# Patient Record
Sex: Female | Born: 1950 | Race: White | Hispanic: No | Smoking: Former smoker
Health system: Southern US, Community
[De-identification: ages and names within clinical notes are randomized; demographics above are authoritative.]

## PROBLEM LIST (undated history)

## (undated) DIAGNOSIS — E7800 Pure hypercholesterolemia, unspecified: Secondary | ICD-10-CM

## (undated) DIAGNOSIS — R109 Unspecified abdominal pain: Secondary | ICD-10-CM

## (undated) DIAGNOSIS — K635 Polyp of colon: Secondary | ICD-10-CM

## (undated) DIAGNOSIS — K5900 Constipation, unspecified: Secondary | ICD-10-CM

## (undated) DIAGNOSIS — J449 Chronic obstructive pulmonary disease, unspecified: Secondary | ICD-10-CM

## (undated) DIAGNOSIS — H3500 Unspecified background retinopathy: Secondary | ICD-10-CM

## (undated) DIAGNOSIS — E785 Hyperlipidemia, unspecified: Secondary | ICD-10-CM

## (undated) DIAGNOSIS — K5790 Diverticulosis of intestine, part unspecified, without perforation or abscess without bleeding: Secondary | ICD-10-CM

## (undated) DIAGNOSIS — Z86010 Personal history of colonic polyps: Secondary | ICD-10-CM

## (undated) DIAGNOSIS — R079 Chest pain, unspecified: Secondary | ICD-10-CM

## (undated) DIAGNOSIS — K219 Gastro-esophageal reflux disease without esophagitis: Secondary | ICD-10-CM

## (undated) DIAGNOSIS — R55 Syncope and collapse: Secondary | ICD-10-CM

## (undated) DIAGNOSIS — E669 Obesity, unspecified: Secondary | ICD-10-CM

## (undated) DIAGNOSIS — M1990 Unspecified osteoarthritis, unspecified site: Secondary | ICD-10-CM

## (undated) DIAGNOSIS — I10 Essential (primary) hypertension: Secondary | ICD-10-CM

## (undated) DIAGNOSIS — F32A Depression, unspecified: Secondary | ICD-10-CM

## (undated) DIAGNOSIS — K589 Irritable bowel syndrome without diarrhea: Secondary | ICD-10-CM

## (undated) DIAGNOSIS — R531 Weakness: Secondary | ICD-10-CM

## (undated) DIAGNOSIS — F329 Major depressive disorder, single episode, unspecified: Secondary | ICD-10-CM

## (undated) DIAGNOSIS — K8020 Calculus of gallbladder without cholecystitis without obstruction: Secondary | ICD-10-CM

## (undated) HISTORY — DX: Unspecified abdominal pain: R10.9

## (undated) HISTORY — DX: Obesity, unspecified: E66.9

## (undated) HISTORY — DX: Personal history of colonic polyps: Z86.010

## (undated) HISTORY — DX: Unspecified osteoarthritis, unspecified site: M19.90

## (undated) HISTORY — PX: MASTECTOMY: SHX3

## (undated) HISTORY — DX: Syncope and collapse: R55

## (undated) HISTORY — DX: Weakness: R53.1

## (undated) HISTORY — DX: Chest pain, unspecified: R07.9

## (undated) HISTORY — DX: Chronic obstructive pulmonary disease, unspecified: J44.9

## (undated) HISTORY — PX: BUNIONECTOMY: SHX129

## (undated) HISTORY — DX: Irritable bowel syndrome without diarrhea: K58.9

## (undated) HISTORY — DX: Constipation, unspecified: K59.00

## (undated) HISTORY — DX: Polyp of colon: K63.5

## (undated) HISTORY — DX: Gastro-esophageal reflux disease without esophagitis: K21.9

## (undated) HISTORY — DX: Major depressive disorder, single episode, unspecified: F32.9

## (undated) HISTORY — DX: Depression, unspecified: F32.A

## (undated) HISTORY — DX: Pure hypercholesterolemia, unspecified: E78.00

## (undated) HISTORY — PX: BREAST LUMPECTOMY: SHX2

## (undated) HISTORY — DX: Hyperlipidemia, unspecified: E78.5

## (undated) HISTORY — DX: Calculus of gallbladder without cholecystitis without obstruction: K80.20

## (undated) HISTORY — DX: Diverticulosis of intestine, part unspecified, without perforation or abscess without bleeding: K57.90

## (undated) HISTORY — DX: Unspecified background retinopathy: H35.00

## (undated) HISTORY — PX: CHOLECYSTECTOMY: SHX55

## (undated) HISTORY — DX: Essential (primary) hypertension: I10

---

## 1998-08-11 ENCOUNTER — Other Ambulatory Visit: Admission: RE | Admit: 1998-08-11 | Discharge: 1998-08-11 | Payer: Self-pay | Admitting: *Deleted

## 1999-10-15 DIAGNOSIS — Z86010 Personal history of colonic polyps: Secondary | ICD-10-CM

## 1999-10-15 HISTORY — DX: Personal history of colonic polyps: Z86.010

## 1999-10-26 ENCOUNTER — Encounter: Payer: Self-pay | Admitting: *Deleted

## 1999-10-26 ENCOUNTER — Ambulatory Visit (HOSPITAL_COMMUNITY): Admission: RE | Admit: 1999-10-26 | Discharge: 1999-10-26 | Payer: Self-pay | Admitting: *Deleted

## 2000-01-10 ENCOUNTER — Other Ambulatory Visit: Admission: RE | Admit: 2000-01-10 | Discharge: 2000-01-10 | Payer: Self-pay | Admitting: Internal Medicine

## 2000-01-10 ENCOUNTER — Encounter (INDEPENDENT_AMBULATORY_CARE_PROVIDER_SITE_OTHER): Payer: Self-pay | Admitting: Specialist

## 2000-10-10 ENCOUNTER — Other Ambulatory Visit: Admission: RE | Admit: 2000-10-10 | Discharge: 2000-10-10 | Payer: Self-pay | Admitting: *Deleted

## 2001-01-13 ENCOUNTER — Ambulatory Visit (HOSPITAL_COMMUNITY): Admission: RE | Admit: 2001-01-13 | Discharge: 2001-01-13 | Payer: Self-pay | Admitting: *Deleted

## 2001-01-13 ENCOUNTER — Encounter: Payer: Self-pay | Admitting: *Deleted

## 2001-10-29 ENCOUNTER — Other Ambulatory Visit: Admission: RE | Admit: 2001-10-29 | Discharge: 2001-10-29 | Payer: Self-pay | Admitting: *Deleted

## 2002-12-08 ENCOUNTER — Other Ambulatory Visit: Admission: RE | Admit: 2002-12-08 | Discharge: 2002-12-08 | Payer: Self-pay | Admitting: *Deleted

## 2004-08-21 ENCOUNTER — Ambulatory Visit (HOSPITAL_COMMUNITY): Admission: RE | Admit: 2004-08-21 | Discharge: 2004-08-21 | Payer: Self-pay | Admitting: *Deleted

## 2004-09-18 ENCOUNTER — Encounter: Admission: RE | Admit: 2004-09-18 | Discharge: 2004-09-18 | Payer: Self-pay | Admitting: *Deleted

## 2005-02-20 ENCOUNTER — Other Ambulatory Visit: Admission: RE | Admit: 2005-02-20 | Discharge: 2005-02-20 | Payer: Self-pay | Admitting: *Deleted

## 2005-03-06 ENCOUNTER — Ambulatory Visit: Payer: Self-pay | Admitting: Internal Medicine

## 2005-04-03 ENCOUNTER — Ambulatory Visit: Payer: Self-pay | Admitting: Internal Medicine

## 2006-04-09 ENCOUNTER — Inpatient Hospital Stay (HOSPITAL_COMMUNITY): Admission: AD | Admit: 2006-04-09 | Discharge: 2006-04-12 | Payer: Self-pay | Admitting: Specialist

## 2006-04-10 ENCOUNTER — Ambulatory Visit: Payer: Self-pay | Admitting: Infectious Diseases

## 2008-03-28 ENCOUNTER — Encounter: Admission: RE | Admit: 2008-03-28 | Discharge: 2008-03-28 | Payer: Self-pay | Admitting: Family Medicine

## 2009-04-27 ENCOUNTER — Encounter: Admission: RE | Admit: 2009-04-27 | Discharge: 2009-04-27 | Payer: Self-pay | Admitting: Family Medicine

## 2010-04-19 ENCOUNTER — Encounter (INDEPENDENT_AMBULATORY_CARE_PROVIDER_SITE_OTHER): Payer: Self-pay | Admitting: *Deleted

## 2010-10-14 DIAGNOSIS — K635 Polyp of colon: Secondary | ICD-10-CM

## 2010-10-14 HISTORY — DX: Polyp of colon: K63.5

## 2010-10-16 ENCOUNTER — Encounter (INDEPENDENT_AMBULATORY_CARE_PROVIDER_SITE_OTHER): Payer: Self-pay | Admitting: *Deleted

## 2010-11-15 NOTE — Letter (Signed)
Summary: Pre Visit Letter Revised  [facility]  [address] [place], [place] [address]   Phone: [phone]  Fax: [phone]        10/16/2010 MRN: [id number] [facility] [address] [place], [place]  [address]             Procedure Date:  December 14, 2010   recall col-Dr Dalene Carrow to the Gastroenterology Division at [facility].    You are scheduled to see a nurse for your pre-procedure visit on November 30, 2010 at 9:00am on the 3rd floor at [facility], [address]  We ask that you try to arrive at our office 15 minutes prior to your appointment time to allow for check-in.  Please take a minute to review the attached form.  If you answer "Yes" to one or more of the questions on the first page, we ask that you call the person listed at your earliest opportunity.  If you answer "No" to all of the questions, please complete the rest of the form and bring it to your appointment.    Your nurse visit will consist of discussing your medical and surgical history, your immediate family medical history, and your medications.   If you are unable to list all of your medications on the form, please bring the medication bottles to your appointment and we will list them.  We will need to be aware of both prescribed and over the counter drugs.  We will need to know exact dosage information as well.    Please be prepared to read and sign documents such as consent forms, a financial agreement, and acknowledgement forms.  If necessary, and with your consent, a friend or relative is welcome to sit-in on the nurse visit with you.  Please bring your insurance card so that we may make a copy of it.  If your insurance requires a referral to see a specialist, please bring your referral form from your primary care physician.  No co-pay is required for this nurse visit.     If you cannot keep your appointment, please call [phone] to cancel or reschedule prior to  your appointment date.  This allows [place] the opportunity to schedule an appointment for another patient in need of care.    Thank you for choosing [facility] for your medical needs.  We appreciate the opportunity to care for you.  Please visit [place] at our website  to learn more about our practice.  Sincerely, The Gastroenterology Division

## 2010-11-15 NOTE — Letter (Signed)
Summary: Colonoscopy Letter  [facility]  [address] [place], [place] [address]   Phone: [phone]  Fax: [phone]      April 19, 2010 MRN: [id number]   [facility] [address] [place], [place]  [address]   Dear Teresa Robbins,   According to your medical record, it is time for you to schedule a Colonoscopy. The American Cancer Society recommends this procedure as a method to detect early colon cancer. Patients with a family history of colon cancer, or a personal history of colon polyps or inflammatory bowel disease are at increased risk.  This letter has beeen generated based on the recommendations made at the time of your procedure. If you feel that in your particular situation this may no longer apply, please contact our office.  Please call our office at [phone] to schedule this appointment or to update your records at your earliest convenience.  Thank you for cooperating with [place] to provide you with the very best care possible.   Sincerely,  Hedwig Morton. Juanda Chance, M.D.  [facility] Gastroenterology Division [phone]

## 2010-11-28 ENCOUNTER — Encounter (INDEPENDENT_AMBULATORY_CARE_PROVIDER_SITE_OTHER): Payer: Self-pay | Admitting: *Deleted

## 2010-11-30 ENCOUNTER — Encounter: Payer: Self-pay | Admitting: Internal Medicine

## 2010-12-05 NOTE — Letter (Signed)
Summary: [facility] - Eat Instructions  [facility]  [address] [place], [place] [address]   Phone: [phone]  Fax: [phone]       Teresa Robbins    02-09-1951    MRN: [id number]       Procedure Day /Date: Friday 12/14/2010     Arrival Time: 8:00 am     Procedure Time: 9:00 am     Location of Procedure:                    _x _  [facility] (4th Floor)    PREPARATION FOR COLONOSCOPY WITH MIRALAX  Starting 5 days prior to your procedure Sunday 2/26 do not eat nuts, seeds, popcorn, corn, beans, peas,  salads, or any raw vegetables.  Do not take any fiber supplements (e.g. Metamucil, Citrucel, and Benefiber). ____________________________________________________________________________________________________   THE DAY BEFORE YOUR PROCEDURE         DATE: Thursday 3/1  1   Drink clear liquids the entire day-NO SOLID FOOD  2   Do not drink anything colored red or purple.  Avoid juices with pulp.  No orange juice.  3   Drink at least 64 oz. (8 glasses) of fluid/clear liquids during the day to prevent dehydration and help the prep work efficiently.  CLEAR LIQUIDS INCLUDE: Water Jello Ice Popsicles Tea (sugar ok, no milk/cream) Powdered fruit flavored drinks Coffee (sugar ok, no milk/cream) Gatorade Juice: apple, white grape, white cranberry  Lemonade Clear bullion, consomm, broth Carbonated beverages (any kind) Strained chicken noodle soup Hard Candy  4   Mix the entire bottle of Miralax with 64 oz. of Gatorade/Powerade in the morning and put in the refrigerator to chill.  5   At 3:00 pm take 2 Dulcolax/Bisacodyl tablets.  6   At 4:30 pm take one Reglan/Metoclopramide tablet.  7  Starting at 5:00 pm drink one 8 oz glass of the Miralax mixture every 15-20 minutes until you have finished drinking the entire 64 oz.  You should finish drinking prep around 7:30 or 8:00 pm.  8   If you are nauseated, you may take the 2nd Reglan/Metoclopramide tablet at  6:30 pm.        9    At 8:00 pm take 2 more DULCOLAX/Bisacodyl tablets.     THE DAY OF YOUR PROCEDURE      DATE:  Friday 3/2  You may drink clear liquids until 7:00 am  (2 HOURS BEFORE PROCEDURE).   MEDICATION INSTRUCTIONS  Unless otherwise instructed, you should take regular prescription medications with a small sip of water as early as possible the morning of your procedure.           OTHER INSTRUCTIONS  You will need a responsible adult at least 61 years of age to accompany you and drive you home.   This person must remain in the waiting room during your procedure.  Wear loose fitting clothing that is easily removed.  Leave jewelry and other valuables at home.  However, you may wish to bring a book to read or an iPod/MP3 player to listen to music as you wait for your procedure to start.  Remove all body piercing jewelry and leave at home.  Total time from sign-in until discharge is approximately 2-3 hours.  You should go home directly after your procedure and rest.  You can resume normal activities the day after your procedure.  The day of your procedure you should not:   Drive  Make legal decisions   Operate machinery   Drink alcohol   Return to work  You will receive specific instructions about eating, activities and medications before you leave.   The above instructions have been reviewed and explained to me by  Ezra Sites RN  November 30, 2010 9:29 AM     I fully understand and can verbalize these instructions _____________________________ Date _______

## 2010-12-05 NOTE — Miscellaneous (Signed)
Summary: [facility]  Clinical Lists Changes  Medications: Added new medication of MIRALAX   POWD (POLYETHYLENE GLYCOL 3350) As per prep  instructions. - Signed Added new medication of REGLAN 10 MG  TABS (METOCLOPRAMIDE HCL) As per prep instructions. - Signed Added new medication of DULCOLAX 5 MG  TBEC (BISACODYL) Day before procedure take 2 at 3pm and 2 at 8pm. - Signed Rx of MIRALAX   POWD (POLYETHYLENE GLYCOL 3350) As per prep  instructions.;  #255gm x 0;  Signed;  Entered by: Ezra Sites RN;  Authorized by: Hart Carwin MD;  Method used: Electronically to [facility] [phone]*, [address] [place], [place], [place]  [address], Ph: [phone], Fax: [phone] Rx of REGLAN 10 MG  TABS (METOCLOPRAMIDE HCL) As per prep instructions.;  #2 x 0;  Signed;  Entered by: Ezra Sites RN;  Authorized by: Hart Carwin MD;  Method used: Electronically to [facility] [phone]*, [address], [place], [place], [place]  [address], Ph: [phone], Fax: [phone] Rx of DULCOLAX 5 MG  TBEC (BISACODYL) Day before procedure take 2 at 3pm and 2 at 8pm.;  #4 x 0;  Signed;  Entered by: Ezra Sites RN;  Authorized by: Hart Carwin MD;  Method used: Electronically to [facility] [phone]*, [address] [place], [place], [place]  [address], Ph: [phone], Fax: [phone] Observations: Added new observation of NKA: T (11/30/2010 9:01)    Prescriptions: DULCOLAX 5 MG  TBEC (BISACODYL) Day before procedure take 2 at 3pm and 2 at 8pm.  #4 x 0   Entered by:   Ezra Sites RN   Authorized by:   Hart Carwin MD   Signed by:   Ezra Sites RN on 11/30/2010   Method used:   Electronically to        [facility] [phone]* (retail)       [address]       [place], [place]  [address]       Ph: [phone]       Fax: [phone]   RxID:   [id number] REGLAN 10 MG  TABS (METOCLOPRAMIDE HCL) As per prep instructions.  #2 x  0   Entered by:   Ezra Sites RN   Authorized by:   Hart Carwin MD   Signed by:   Ezra Sites RN on 11/30/2010   Method used:   Electronically to        [facility] [phone]* (retail)       [address]       [place], [place]  [address]       Ph: [phone]       Fax: [phone]   RxID:   [id number] MIRALAX   POWD (POLYETHYLENE GLYCOL 3350) As per prep  instructions.  #255gm x 0   Entered by:   Ezra Sites RN   Authorized by:   Hart Carwin MD   Signed by:   Ezra Sites RN on 11/30/2010   Method used:   Electronically to        [facility] [phone]* (retail)       [address]       [place], [place]  [address]       Ph: [phone]  Fax: [phone]   RxID:   [id number]

## 2010-12-14 ENCOUNTER — Other Ambulatory Visit (AMBULATORY_SURGERY_CENTER): Payer: BC Managed Care – PPO | Admitting: Internal Medicine

## 2010-12-14 ENCOUNTER — Other Ambulatory Visit: Payer: Self-pay | Admitting: Internal Medicine

## 2010-12-14 DIAGNOSIS — K621 Rectal polyp: Secondary | ICD-10-CM

## 2010-12-14 DIAGNOSIS — Z1211 Encounter for screening for malignant neoplasm of colon: Secondary | ICD-10-CM

## 2010-12-14 DIAGNOSIS — D128 Benign neoplasm of rectum: Secondary | ICD-10-CM

## 2010-12-14 DIAGNOSIS — Z800 Family history of malignant neoplasm of digestive organs: Secondary | ICD-10-CM

## 2010-12-14 DIAGNOSIS — K5730 Diverticulosis of large intestine without perforation or abscess without bleeding: Secondary | ICD-10-CM

## 2010-12-14 DIAGNOSIS — D129 Benign neoplasm of anus and anal canal: Secondary | ICD-10-CM

## 2010-12-18 ENCOUNTER — Encounter: Payer: Self-pay | Admitting: Internal Medicine

## 2010-12-20 NOTE — Procedures (Addendum)
Summary: Colonoscopy  Patient: Teresa Robbins Note: All result statuses are Final unless otherwise noted.  Tests: (1) Colonoscopy (COL)   COL Colonoscopy           DONE     [facility]     [address]     [place], [place]  [address]          COLONOSCOPY PROCEDURE REPORT          PATIENT:  Teresa, Robbins  MR#:  [id number]     BIRTHDATE:  1950-10-22, 59 yrs. old  GENDER:  female     ENDOSCOPIST:  Hedwig Morton. Juanda Chance, MD     REF. BY:Dr Noelle Redmon     PROCEDURE DATE:  12/14/2010     PROCEDURE:  Colonoscopy 19147     ASA CLASS:  Class I     INDICATIONS:  family history of colon cancer colon cancer in a     parent, tubular adenoma in 2001     last colon 2006     MEDICATIONS:   Versed 10 mg, Fentanyl 100 mcg, Benadryl 12.5 mg          DESCRIPTION OF PROCEDURE:   After the risks benefits and     alternatives of the procedure were thoroughly explained, informed     consent was obtained.  Digital rectal exam was performed and     revealed no rectal masses.   The LB [id number] endoscope     was introduced through the anus and advanced to the cecum, which     was identified by both the appendix and ileocecal valve, without     limitations.  The quality of the prep was good, using MiraLax.     The instrument was then slowly withdrawn as the colon was fully     examined.     <<PROCEDUREIMAGES>>          FINDINGS:  A diminutive polyp was found in the rectum. The polyp     was removed using cold biopsy forceps (see image7).  Mild     diverticulosis was found (see image5).  This was otherwise a     normal examination of the colon (see image8, image4, image3, and     image2).   Retroflexed views in the rectum revealed no     abnormalities.    The scope was then withdrawn from the patient     and the procedure completed.          COMPLICATIONS:  None     ENDOSCOPIC IMPRESSION:     1) Diminutive polyp in the rectum     2) Mild diverticulosis     3) Otherwise normal  examination     RECOMMENDATIONS:     1) Await pathology results     REPEAT EXAM:  In 5 year(s) for.          ______________________________     Hedwig Morton. Juanda Chance, MD          CC:          n.     eSIGNED:   Hedwig Morton. Aadhya Bustamante at 12/14/2010 09:56 AM          Bryson Corona, [id number]  Note: An exclamation mark (!) indicates a result that was not dispersed into the flowsheet. Document Creation Date: 12/14/2010 9:57 AM _______________________________________________________________________  (1) Order result status: Final Collection or observation date-time: 12/14/2010 09:49 Requested date-time:  Receipt date-time:  Reported date-time:  Referring Physician:  Ordering Physician: Lina Sar [phone]) Specimen Source:  Source: Launa Grill Order Number: [id number] Lab site:   Appended Document: Colonoscopy     Procedures Next Due Date:    Colonoscopy: 12/2015

## 2010-12-25 NOTE — Letter (Addendum)
Summary: Patient Notice- Polyp Results  [facility]  [address] [place], [place] [address]   Phone: [phone]  Fax: [phone]        December 18, 2010 MRN: [id number]    [facility] [address] [place], [place]  [address]    Dear Ms. Sweis,  I am pleased to inform you that the colon polyp(s) removed during your recent colonoscopy was (were) found to be benign (no cancer detected) upon pathologic examination.Your polyp was hyperplastic ( not precancerous)  I recommend you have a repeat colonoscopy examination in _5 years to look for recurrent polyps, as having colon polyps increases your risk for having recurrent polyps or even colon cancer in the future.  Should you develop new or worsening symptoms of abdominal pain, bowel habit changes or bleeding from the rectum or bowels, please schedule an evaluation with either your primary care physician or with me.  Additional information/recommendations:  _x_ No further action with gastroenterology is needed at this time. Please      follow-up with your primary care physician for your other healthcare      needs.  __ Please call [phone] to schedule a return visit to review your      situation.  __ Please keep your follow-up visit as already scheduled.  __ Continue treatment plan as outlined the day of your exam.  Please call us if you are having persistent problems or have questions about your condition that have not been fully answered at this time.  Sincerely,  Hart Carwin MD  This letter has been electronically signed by your physician.  Appended Document: Patient Notice- Polyp Results letter mailed  Appended Document: Patient Notice- Polyp Results Letter returned by [employer]PS- "Return to sender-Insufficient address-unable to forward"  Letter/envelope filed in pt's [facility] chart.

## 2011-01-28 ENCOUNTER — Ambulatory Visit
Admission: RE | Admit: 2011-01-28 | Discharge: 2011-01-28 | Disposition: A | Discharge status: Home / Self Care | Payer: BLUE CROSS/BLUE SHIELD | Source: Ambulatory Visit | Attending: Physician Assistant | Admitting: Physician Assistant

## 2011-01-28 ENCOUNTER — Other Ambulatory Visit: Payer: Self-pay | Admitting: Physician Assistant

## 2011-01-28 ENCOUNTER — Other Ambulatory Visit: Payer: Self-pay | Admitting: Family Medicine

## 2011-01-28 DIAGNOSIS — M79672 Pain in left foot: Secondary | ICD-10-CM

## 2011-03-01 NOTE — Discharge Summary (Signed)
Teresa Robbins, Teresa Robbins NO.:  [id number]   MEDICAL RECORD NO.:  [id number]          PATIENT TYPE:  INP   LOCATION:  6734                         FACILITY:  [facility]   PHYSICIAN:  Kerrin Champagne, M.D.   DATE OF BIRTH:  17-Nov-1950   DATE OF ADMISSION:  04/09/2006  DATE OF DISCHARGE:  04/12/2006                                 DISCHARGE SUMMARY   DISCHARGE DIAGNOSES:  1. Right hand subcutaneous palmar abscess with cellulitis ascending.  2. History of hypertension.  3. History of depression.   PROCEDURES PERFORMED DURING THIS HOSPITALIZATION:  Patient had IV  antibiotics while hospitalized for right hand palmar abscess that had been  drained in the office, this same day of admission.   HISTORY OF PRESENT ILLNESS:  This patient is a 60 year old, right hand  dominant, female.  She has been experiencing pain, discomfort, swelling and  warmth associated with her right hand in the area of the ring finger MCP  joint.  She was seen in our office today after a history of increasing hand  swelling, discomfort and pain, which had not responded to oral antibiotics  over the last 2 to 3 days.  She had a history of smoking one pack of  cigarettes per day.  There is a family history of breast CA.  Primary care  physician is Dr. Azucena Cecil in [place].   PAST MEDICAL HISTORY:  Is significant for:  1. Hypertension.  2. Depression.   PHYSICAL EXAMINATION:  Her physical examination findings, please refer to  the patient's written history and physical examination for these findings.  Basically, an area of open draining wound over the right palmar aspect of  the hand, overlying the MCP joint of the long finger.  This has undergone  local incision and drainage with cultures taken in the office early today.   ADMISSION LABORATORY DATA:  Demonstrated a white cell count of 8.5,  hemoglobin was 13.4, hematocrit 39.4.  Sedimentation rate elevated at 28.  Sodium 134, potassium 3.8, chloride  99, CO2 28, glucose 83, BUN 11,  creatinine 0.7.  Liver function tests were normal.   Patient's admission temperature was 97.7.  Vital signs were stable.   HOSPITAL COURSE:  Teresa Robbins was seen in the office today at [facility] and judged to have a right hand subcutaneous abscess with  ascending cellulitis.  Patient's laboratory tests only showed elevation of  her sedimentation rate; however, her clinical exam was concerning for an  infection that had been partially treated with antibiotics, unsuccessfully  with increasing concern of ascending infection.  She was, therefore,  admitted to [facility], placed on IV antibiotics of vancomycin,  later Cipro was added to this regimen.  Patient underwent cultures at the  office.  She was then begun on, post admission day number 1, with warm soapy  dressing changes with removal of Wic from the wound on the second day of  admission.  Following this, the institution of antibiotics, especially with  the Cipro, the patient showed marked and diminished erythema and swelling of  the right  hand.  Results of the patient's culture and sensitivity indicated  that the patient had infection with gram negative species.  Patient was able  to be transferred to p.o. ciprofloxacin with continued improved in her  status.  On April 12, 2006, her status was felt to be stable and with  decreased erythema and increasing improvement in the wound, the patient was  discharged home on Cipro via oral route.  Patient is to use warm soaks and  soapy water twice a day until healed.  Perform range of motion exercises on  her own.   DISPOSITION:  Teresa Robbins was discharged home on April 12, 2006.  She  will remain on ciprofloxacin 500 mg p.o. twice a day.  She is to take Motrin  or Tylenol as needed for discomfort.  Is seen back in the office for  followup in 2 to 3 days.  She may bath and change the dressing daily.   STATUS AT THE TIME OF  DISCHARGE:  Improved.      Kerrin Champagne, M.D.  Electronically Signed     JEN/MEDQ  D:  05/22/2006  T:  05/22/2006  Job:  [id number]

## 2011-12-10 ENCOUNTER — Telehealth: Payer: Self-pay | Admitting: Internal Medicine

## 2011-12-10 NOTE — Telephone Encounter (Signed)
Spoke with Clydie Braun and gave her appointment date and time. She will fax over Dr. Donnetta Hail records.

## 2011-12-10 NOTE — Telephone Encounter (Signed)
Scheduled patient on 12/17/11 at 2:30 PM with Dr. Juanda Chance. Left a message for Clydie Braun to call me.

## 2011-12-11 ENCOUNTER — Other Ambulatory Visit: Payer: Self-pay | Admitting: *Deleted

## 2011-12-11 ENCOUNTER — Encounter: Payer: Self-pay | Admitting: *Deleted

## 2011-12-17 ENCOUNTER — Ambulatory Visit (INDEPENDENT_AMBULATORY_CARE_PROVIDER_SITE_OTHER): Payer: Managed Care, Other (non HMO) | Admitting: Internal Medicine

## 2011-12-17 ENCOUNTER — Encounter: Payer: Self-pay | Admitting: Internal Medicine

## 2011-12-17 VITALS — BP 128/82 | HR 88 | Ht 64.0 in | Wt 171.4 lb

## 2011-12-17 DIAGNOSIS — K648 Other hemorrhoids: Secondary | ICD-10-CM

## 2011-12-17 MED ORDER — HYDROCORTISONE ACETATE 25 MG RE SUPP: 25.0000 mg | Freq: Every day | RECTAL | Status: DC

## 2011-12-17 NOTE — Patient Instructions (Addendum)
You have been given a separate informational sheet regarding your tobacco use, the importance of quitting and local resources to help you quit. Follow up with Dr Juanda Chance as needed. We have sent the following medications to your pharmacy for you to pick up at your convenience: Anusol Cc: Dr Milus Height, Dr Jennette Kettle

## 2011-12-17 NOTE — Progress Notes (Signed)
Teresa Robbins 05/18/1951 MRN [id number]    History of Present Illness:  This is a 61 year old white female with rectal tissue prolapse which bothers her at times, especially when she has to strain to go to the bathroom. She has been chronically constipated. We have done a colonoscopy in March 2012 and prior to that in 2006 for a family history of multiple colon cancer in her parents. She had a tubular adenoma and hyperplastic rectal polyps. On a recent gynecological exam by Dr. Jennette Kettle, she was found to have a piece of prolapsing tissue.   Past Medical History  Diagnosis Date  . Diverticulosis   . Hyperplastic colon polyp   . Hx of adenomatous colonic polyps   . IBS (irritable bowel syndrome)   . Depression   . Hypertension   . HLD (hyperlipidemia)   . Obesity    Past Surgical History  Procedure Date  . Bunionectomy     bilateral  . Cesarean section     w/tubal ligation  . Breast lumpectomy     right    reports that she has been smoking.  She has never used smokeless tobacco. She reports that she does not drink alcohol or use illicit drugs. family history includes Breast cancer in her cousin, daughter, and paternal aunt; Colon polyps in her father and mother; Depression in her brother and mother; Hypertension in her father and unspecified family member; Prostate cancer in her father; and Stroke in her paternal grandmother. No Known Allergies      Review of Systems: Positive for difficult evacuation of the stool. Positive for constipation. Negative for abdominal pain. Positive for small-volume hematochezia  The remainder of the 10 point ROS is negative except as outlined in H&P   Physical Exam: General appearance  Well developed, in no distress. Eyes- non icteric. HEENT nontraumatic, normocephalic. Mouth no lesions, tongue papillated, no cheilosis. Neck supple without adenopathy, thyroid not enlarged, no carotid bruits, no JVD. Lungs Clear to auscultation bilaterally. Cor  normal soft nontender abdomen with normoactive bowel sounds Rectal: And anoscopic exam reveals several external hemorrhoidal tags, one large tag which is prolapsing through the anal canal and it appears as a fleshy tissue with seepage of mucus and is likely causing the patient's bleeding as well as leakage. Her rectal sphincter tone is diminished. Stool is Hemoccult negative. There is no fissure and no polyps. Extremities no pedal edema. Skin no lesions. Neurological alert and oriented x 3. Psychological normal mood and affect.  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 She is really not having many problems with that although she sees blood occasionally. She has had a recent colonoscopy which ruled out a significant lesion. I gave her an option to have this removed surgically but she does not mind living with it. We have discussed at length her constipation regimen which will include milk of magnesia and MiraLax. I have also encouraged her to be physically active to improve her pelvic floor muscles so she can evacuate stool. We will send her Anusol-HC suppositories to use at night.   12/17/2011 Lina Sar

## 2012-09-04 ENCOUNTER — Other Ambulatory Visit: Payer: Self-pay | Admitting: Physician Assistant

## 2012-09-04 ENCOUNTER — Ambulatory Visit
Admission: RE | Admit: 2012-09-04 | Discharge: 2012-09-04 | Disposition: A | Discharge status: Home / Self Care | Payer: Self-pay | Source: Ambulatory Visit | Attending: Physician Assistant | Admitting: Physician Assistant

## 2012-09-04 DIAGNOSIS — W19XXXA Unspecified fall, initial encounter: Secondary | ICD-10-CM

## 2012-10-16 ENCOUNTER — Encounter (INDEPENDENT_AMBULATORY_CARE_PROVIDER_SITE_OTHER): Payer: Self-pay | Admitting: General Surgery

## 2012-10-16 ENCOUNTER — Ambulatory Visit (INDEPENDENT_AMBULATORY_CARE_PROVIDER_SITE_OTHER): Payer: Self-pay | Admitting: General Surgery

## 2012-10-16 ENCOUNTER — Other Ambulatory Visit (INDEPENDENT_AMBULATORY_CARE_PROVIDER_SITE_OTHER): Payer: Self-pay | Admitting: General Surgery

## 2012-10-16 ENCOUNTER — Ambulatory Visit (INDEPENDENT_AMBULATORY_CARE_PROVIDER_SITE_OTHER): Payer: 59 | Admitting: General Surgery

## 2012-10-16 VITALS — BP 130/72 | HR 74 | Temp 97.8°F | Resp 16 | Ht 64.5 in | Wt 156.2 lb

## 2012-10-16 DIAGNOSIS — K7689 Other specified diseases of liver: Secondary | ICD-10-CM

## 2012-10-16 DIAGNOSIS — K8020 Calculus of gallbladder without cholecystitis without obstruction: Secondary | ICD-10-CM | POA: Insufficient documentation

## 2012-10-16 NOTE — Patient Instructions (Signed)
Your episodes of abdominal pain, fatty food intolerance and nausea are almost certainly due to your multiple gallstones. You will continue to have these attacks until something is done and your gallbladder is removed.  Your ultrasound suggests that you have a complex, septated cyst in the left lobe of the liver, and so we're going to schedule an MRI of the liver to make sure that this is a benign cyst.  Because of your episodes of dizziness and tachycardia, you are scheduled to see a cardiologist, Dr. Verdis Prime on January 8. It would be advisable for him to complete his cardiology evaluation before you have your gallbladder surgery.  You will tentatively be scheduled for laparoscopic cholecystectomy with cholangiogram, possible open cholecystectomy sometime in the latter half of January.     Laparoscopic Cholecystectomy Laparoscopic cholecystectomy is surgery to remove the gallbladder. The gallbladder is located slightly to the right of center in the abdomen, behind the liver. It is a concentrating and storage sac for the bile produced in the liver. Bile aids in the digestion and absorption of fats. Gallbladder disease (cholecystitis) is an inflammation of your gallbladder. This condition is usually caused by a buildup of gallstones (cholelithiasis) in your gallbladder. Gallstones can block the flow of bile, resulting in inflammation and pain. In severe cases, emergency surgery may be required. When emergency surgery is not required, you will have time to prepare for the procedure. Laparoscopic surgery is an alternative to open surgery. Laparoscopic surgery usually has a shorter recovery time. Your common bile duct may also need to be examined and explored. Your caregiver will discuss this with you if he or she feels this should be done. If stones are found in the common bile duct, they may be removed. LET YOUR CAREGIVER KNOW ABOUT:  Allergies to food or medicine.  Medicines taken, including  vitamins, herbs, eyedrops, over-the-counter medicines, and creams.  Use of steroids (by mouth or creams).  Previous problems with anesthetics or numbing medicines.  History of bleeding problems or blood clots.  Previous surgery.  Other health problems, including diabetes and kidney problems.  Possibility of pregnancy, if this applies. RISKS AND COMPLICATIONS All surgery is associated with risks. Some problems that may occur following this procedure include:  Infection.  Damage to the common bile duct, nerves, arteries, veins, or other internal organs such as the stomach or intestines.  Bleeding.  A stone may remain in the common bile duct. BEFORE THE PROCEDURE  Do not take aspirin for 3 days prior to surgery or blood thinners for 1 week prior to surgery.  Do not eat or drink anything after midnight the night before surgery.  Let your caregiver know if you develop a cold or other infectious problem prior to surgery.  You should be present 60 minutes before the procedure or as directed. PROCEDURE  You will be given medicine that makes you sleep (general anesthetic). When you are asleep, your surgeon will make several small cuts (incisions) in your abdomen. One of these incisions is used to insert a small, lighted scope (laparoscope) into the abdomen. The laparoscope helps the surgeon see into your abdomen. Carbon dioxide gas will be pumped into your abdomen. The gas allows more room for the surgeon to perform your surgery. Other operating instruments are inserted through the other incisions. Laparoscopic procedures may not be appropriate when:  There is major scarring from previous surgery.  The gallbladder is extremely inflamed.  There are bleeding disorders or unexpected cirrhosis of the liver.  A pregnancy is near term.  Other conditions make the laparoscopic procedure impossible. If your surgeon feels it is not safe to continue with a laparoscopic procedure, he or she  will perform an open abdominal procedure. In this case, the surgeon will make an incision to open the abdomen. This gives the surgeon a larger view and field to work within. This may allow the surgeon to perform procedures that sometimes cannot be performed with a laparoscope alone. Open surgery has a longer recovery time. AFTER THE PROCEDURE  You will be taken to the recovery area where a nurse will watch and check your progress.  You may be allowed to go home the same day.  Do not resume physical activities until directed by your caregiver.  You may resume a normal diet and activities as directed. Document Released: 09/30/2005 Document Revised: 12/23/2011 Document Reviewed: 03/15/2011 [facility] Patient Information 2013 [place], [place].

## 2012-10-16 NOTE — Progress Notes (Signed)
Patient ID: Teresa Robbins, female   DOB: 01-08-1951, 62 y.o.   MRN: [id number]  Chief Complaint  Patient presents with  . Cholelithiasis    HPI Teresa Robbins is a 62 y.o. female.  She is referred by Norm Parcel, PA and Dr. Lupe Carney at [place] [facility] for evaluation of symptomatic gallstones an incidental finding of a cyst of the left lobe of the liver.  This patient has had 3 or 4 episodes of epigastric pain radiating to her back. This will usually happen in the afternoon in the last few hours and then go away. She also has some fatty food intolerance causing nausea. No change in her bowel habits.  Last  November she had an episode of nausea and dizziness and had a syncopal episode, fell and fractured 3 ribs on the left side. After taking this history, they ordered a gallbladder ultrasound which was done at [facility]. The ultrasound shows that the gallbladder is filled with small gallstones. There is also a somewhat complex cystic mass in the left lower liver with septations and perhaps a little bit of nodularity but no internal color flow. Dimension is 1.9 cm. They recommend MRI.  The patient is asymptomatic today. She states that because of her syncopal episode and a couple of episodes of tachycardia that she is scheduled to see Dr. Garnette Scheuermann on January 8  for cardiology evaluation. She has no prior history of cardiac problems.  Past history is significant for hypertension, mild depression, hemorrhoids, retinopathy and, and a C-section. She gets routine mammograms and colonoscopy. HPI  Past Medical History  Diagnosis Date  . Diverticulosis   . Hyperplastic colon polyp   . Hx of adenomatous colonic polyps   . IBS (irritable bowel syndrome)   . Depression   . Hypertension   . Obesity   . Retinopathy   . HLD (hyperlipidemia)   . Hypercholesterolemia   . Gallstones   . Chest pain   . Abdominal pain   . Constipation   . Fainting   . Weakness     Past Surgical History  Procedure  Date  . Bunionectomy 1995 - approximate    bilateral  . Breast lumpectomy 1997 - approximate    right  . Cesarean section 1987    w/tubal ligation    Family History  Problem Relation Age of Onset  . Colon polyps Mother   . Depression Mother   . COPD Mother   . Colon polyps Father   . Prostate cancer Father   . Hypertension Father   . Cancer Father     prostate  . Depression Brother   . Stroke Paternal Grandmother   . Breast cancer Daughter   . Cancer Daughter     breast  . Breast cancer Paternal Aunt   . Cancer Paternal Aunt     breast  . Breast cancer Cousin   . Cancer Cousin     breast - on mom's side  . Hypertension      brother, sister, daughter    Social History History  Substance Use Topics  . Smoking status: Former Smoker    Quit date: 03/13/2012  . Smokeless tobacco: Never Used  . Alcohol Use: No    No Known Allergies  Current Outpatient Prescriptions  Medication Sig Dispense Refill  . amLODipine (NORVASC) 10 MG tablet Take 10 mg by mouth daily.      Marland Kitchen aspirin 81 MG tablet Take 81 mg by mouth daily.      Marland Kitchen  Calcium Carbonate-Vitamin D (CALCIUM 600/VITAMIN D) 600-400 MG-UNIT per tablet Take 1 tablet by mouth daily.      Marland Kitchen estrogen, conjugated,-medroxyprogesterone (PREMPRO) 0.625-2.5 MG per tablet Take 1 tablet by mouth daily.      . irbesartan (AVAPRO) 300 MG tablet Take 300 mg by mouth at bedtime.      . Multiple Vitamins-Minerals (MULTIVITAMIN PO) Take by mouth daily.      Marland Kitchen PARoxetine (PAXIL) 10 MG tablet Take 10 mg by mouth every morning.        Review of Systems Review of Systems  Constitutional: Negative for fever, chills and unexpected weight change.  HENT: Negative for hearing loss, congestion, sore throat, trouble swallowing and voice change.   Eyes: Negative for visual disturbance.  Respiratory: Positive for wheezing. Negative for cough.   Cardiovascular: Positive for palpitations. Negative for chest pain and leg swelling.    Gastrointestinal: Positive for nausea and abdominal pain. Negative for vomiting, diarrhea, constipation, blood in stool, abdominal distention and anal bleeding.  Genitourinary: Negative for hematuria, vaginal bleeding and difficulty urinating.  Musculoskeletal: Negative for arthralgias.  Skin: Negative for rash and wound.  Neurological: Positive for syncope. Negative for seizures and headaches.  Hematological: Negative for adenopathy. Does not bruise/bleed easily.  Psychiatric/Behavioral: Negative for confusion.    Blood pressure 130/72, pulse 74, temperature 97.8 F (36.6 C), temperature source Temporal, resp. rate 16, height 5' 4.5" (1.638 m), weight 156 lb 4 oz (70.875 kg).  Physical Exam Physical Exam  Constitutional: She is oriented to person, place, and time. She appears well-developed and well-nourished. No distress.  HENT:  Head: Normocephalic and atraumatic.  Nose: Nose normal.  Mouth/Throat: No oropharyngeal exudate.  Eyes: Conjunctivae normal and EOM are normal. Pupils are equal, round, and reactive to light. Left eye exhibits no discharge. No scleral icterus.  Neck: Neck supple. No JVD present. No tracheal deviation present. No thyromegaly present.  Cardiovascular: Normal rate, regular rhythm, normal heart sounds and intact distal pulses.   No murmur heard. Pulmonary/Chest: Effort normal and breath sounds normal. No respiratory distress. She has no wheezes. She has no rales. She exhibits no tenderness.       Tender to percussion the left chest wall due to rib fracture  Abdominal: Soft. Bowel sounds are normal. She exhibits no distension and no mass. There is no tenderness. There is no rebound and no guarding.       Abdomen soft. Nontender. Liver not enlarged. No hernias noted.Pfannenstiel incision.  Musculoskeletal: She exhibits no edema and no tenderness.  Lymphadenopathy:    She has no cervical adenopathy.  Neurological: She is alert and oriented to person, place, and  time. She exhibits normal muscle tone. Coordination normal.  Skin: Skin is warm. No rash noted. She is not diaphoretic. No erythema. No pallor.  Psychiatric: She has a normal mood and affect. Her behavior is normal. Judgment and thought content normal.    Data Reviewed Office notes and lab work from [place]. Ultrasound at [facility].  Assessment    Chronic cholecystitis with cholelithiasis. Almost certainly she is having attacks of biliary colic. She is at risk for progression and elective cholecystectomy is advised.  1.9 cm complex cyst left lobe of liver. Probably benign. Preop MRI would be recommended to rule out neoplasia.  Palpitations and syncopal episode. Cardiology evaluation with Dr. Katrinka Blazing pending  History cesarean section  Hypertension    Plan    Cardiology evaluation scheduled January 8 with Dr. Katrinka Blazing. His evaluation should be completed prior to  general anesthesia  Schedule MRI of the liver to evaluate complex cyst of left lobe of liver  At the patient's request, we will tentatively schedule her for laparoscopic cholecystectomy with cholangiogram later this month after the other evaluations are completed. She requests that we do this at the [facility] since she has a family member that works there.   I think that would be appropriate, assuming cardiology evaluation is in order since she is otherwise healthy.  I discussed the indications, details, techniques, and numerous risks of the surgery with her. She's been given given written patient information. She understands these issues. Her questions were answered.  she agrees with this plan.       Angelia Mould. Derrell Lolling, M.D., [facility] General and Minimally invasive Surgery Breast and Colorectal Surgery Office:   [phone] Pager:   [phone]  10/16/2012, 4:35 PM

## 2012-10-16 NOTE — Progress Notes (Signed)
Faxed copy of ultrasound of abdomen dated 09/29/12 to [facility] for their records. The ultrasound was performed at [facility]. Confirmation of fax received.

## 2012-10-22 ENCOUNTER — Ambulatory Visit
Admission: RE | Admit: 2012-10-22 | Discharge: 2012-10-22 | Disposition: A | Discharge status: Home / Self Care | Payer: 59 | Source: Ambulatory Visit | Attending: General Surgery | Admitting: General Surgery

## 2012-10-22 MED ORDER — GADOBENATE DIMEGLUMINE 529 MG/ML IV SOLN
14.0000 mL | Freq: Once | INTRAVENOUS | Status: AC | PRN
  Administered 2012-10-22: 14 mL via INTRAVENOUS

## 2012-10-23 ENCOUNTER — Telehealth (INDEPENDENT_AMBULATORY_CARE_PROVIDER_SITE_OTHER): Payer: Self-pay | Admitting: General Surgery

## 2012-10-23 NOTE — Telephone Encounter (Signed)
Patient called per Dr. Jacinto Halim request to advise of the radiology report "tell her that the liver spot is a benign cyst and nothing further needs to be done. Good news". Patient pleased with results.

## 2012-11-10 ENCOUNTER — Other Ambulatory Visit (INDEPENDENT_AMBULATORY_CARE_PROVIDER_SITE_OTHER): Payer: Self-pay | Admitting: General Surgery

## 2012-11-10 DIAGNOSIS — K8010 Calculus of gallbladder with chronic cholecystitis without obstruction: Secondary | ICD-10-CM

## 2012-11-24 ENCOUNTER — Encounter (INDEPENDENT_AMBULATORY_CARE_PROVIDER_SITE_OTHER): Payer: Self-pay | Admitting: General Surgery

## 2012-11-24 ENCOUNTER — Ambulatory Visit (INDEPENDENT_AMBULATORY_CARE_PROVIDER_SITE_OTHER): Payer: 59 | Admitting: General Surgery

## 2012-11-24 VITALS — BP 122/76 | HR 78 | Temp 98.0°F | Resp 18 | Ht 64.0 in | Wt 151.0 lb

## 2012-11-24 DIAGNOSIS — K8020 Calculus of gallbladder without cholecystitis without obstruction: Secondary | ICD-10-CM

## 2012-11-24 NOTE — Progress Notes (Signed)
Patient ID: Teresa Robbins, female   DOB: October 10, 1951, 62 y.o.   MRN: [id number] History: This patient underwent laparoscopic cholecystectomy with cholangiogram on 11/10/2012. Final pathology report shows chronic cholecystitis with cholelithiasis. She has recovered uneventfully. Tolerating regular diet. Normal bowel movements. No significant wound problems  Exam: Patient looks well. No distress Abdomen soft. Nontender. All trocar sites healing well  Assessment: Chronic cholecystitis with cholelithiasis, uneventful recovery following laparoscopic cholecystectomy with cholangiogram  Plan: resume normal activities. No restrictions Low-fat diet Return to see me if further problems arise.   Angelia Mould. Derrell Lolling, M.D., [facility] General and Minimally invasive Surgery Breast and Colorectal Surgery Office:   [phone] Pager:   [phone]

## 2012-11-24 NOTE — Patient Instructions (Signed)
You appear to be recovering from your gallbladder surgery without any surgical complications.  You had been given a copy of the pathology report and a copy of the MRI of the liver.  I advise a low fat, high fiber diet.  You may resume normal physical activities without restriction  Return to see Dr. Derrell Lolling if further problems arise.

## 2012-11-30 ENCOUNTER — Encounter (INDEPENDENT_AMBULATORY_CARE_PROVIDER_SITE_OTHER): Payer: Self-pay

## 2013-07-14 ENCOUNTER — Other Ambulatory Visit: Payer: Self-pay | Admitting: Physician Assistant

## 2013-07-14 ENCOUNTER — Ambulatory Visit
Admission: RE | Admit: 2013-07-14 | Discharge: 2013-07-14 | Disposition: A | Discharge status: Home / Self Care | Payer: 59 | Source: Ambulatory Visit | Attending: Physician Assistant | Admitting: Physician Assistant

## 2013-07-14 DIAGNOSIS — M542 Cervicalgia: Secondary | ICD-10-CM

## 2014-01-04 ENCOUNTER — Ambulatory Visit: Payer: 59 | Admitting: Nurse Practitioner

## 2014-01-07 ENCOUNTER — Ambulatory Visit: Payer: 59 | Admitting: Nurse Practitioner

## 2015-01-10 ENCOUNTER — Other Ambulatory Visit: Payer: Self-pay | Admitting: Obstetrics & Gynecology

## 2015-01-11 LAB — CYTOLOGY - PAP

## 2015-03-22 ENCOUNTER — Other Ambulatory Visit: Payer: Self-pay | Admitting: Obstetrics & Gynecology

## 2015-03-22 DIAGNOSIS — R928 Other abnormal and inconclusive findings on diagnostic imaging of breast: Secondary | ICD-10-CM

## 2015-03-28 ENCOUNTER — Ambulatory Visit
Admission: RE | Admit: 2015-03-28 | Discharge: 2015-03-28 | Disposition: A | Discharge status: Home / Self Care | Payer: 59 | Source: Ambulatory Visit | Attending: Obstetrics & Gynecology | Admitting: Obstetrics & Gynecology

## 2015-03-28 ENCOUNTER — Other Ambulatory Visit: Payer: Self-pay | Admitting: Obstetrics & Gynecology

## 2015-03-28 DIAGNOSIS — R928 Other abnormal and inconclusive findings on diagnostic imaging of breast: Secondary | ICD-10-CM

## 2015-03-28 DIAGNOSIS — N6320 Unspecified lump in the left breast, unspecified quadrant: Secondary | ICD-10-CM

## 2015-03-29 ENCOUNTER — Other Ambulatory Visit: Payer: Self-pay | Admitting: Obstetrics & Gynecology

## 2015-03-29 DIAGNOSIS — N6320 Unspecified lump in the left breast, unspecified quadrant: Secondary | ICD-10-CM

## 2015-03-30 ENCOUNTER — Ambulatory Visit
Admission: RE | Admit: 2015-03-30 | Discharge: 2015-03-30 | Disposition: A | Discharge status: Home / Self Care | Payer: 59 | Source: Ambulatory Visit | Attending: Obstetrics & Gynecology | Admitting: Obstetrics & Gynecology

## 2015-03-30 DIAGNOSIS — N6320 Unspecified lump in the left breast, unspecified quadrant: Secondary | ICD-10-CM

## 2015-04-06 ENCOUNTER — Other Ambulatory Visit: Payer: 59

## 2015-04-07 ENCOUNTER — Telehealth: Payer: Self-pay | Admitting: *Deleted

## 2015-04-07 NOTE — Telephone Encounter (Signed)
Received referral from [place].  Called pt and confirmed 04/12/15 med onc appt and 7/5 genetic appt w/ pt.  Mailed before appt letter, calendar, welcoming packet & intake form to pt. Emailed [place] & [place] at [facility] to make them aware.  Placed a copy of records in Dr. Geralyn Flash box and took one to HIM to scan.

## 2015-04-10 ENCOUNTER — Telehealth: Payer: Self-pay | Admitting: *Deleted

## 2015-04-10 NOTE — Telephone Encounter (Signed)
Pt called requesting for me to cancel her appts w/ med onc and genetics due to her going to [facility] to be treated since her daughter goes there and they know the family history.  I have cancelled the appts as requested and emailed Bary Castilla and [place] at [facility] to make them aware.

## 2015-04-12 ENCOUNTER — Ambulatory Visit: Payer: 59 | Admitting: Hematology and Oncology

## 2015-04-12 ENCOUNTER — Ambulatory Visit: Payer: 59

## 2015-04-18 ENCOUNTER — Encounter: Payer: 59 | Admitting: Genetic Counselor

## 2015-04-18 ENCOUNTER — Other Ambulatory Visit: Payer: 59

## 2015-05-02 ENCOUNTER — Ambulatory Visit
Admission: RE | Admit: 2015-05-02 | Discharge: 2015-05-02 | Disposition: A | Discharge status: Home / Self Care | Payer: 59 | Source: Ambulatory Visit | Attending: Physician Assistant | Admitting: Physician Assistant

## 2015-05-02 ENCOUNTER — Other Ambulatory Visit: Payer: Self-pay | Admitting: Physician Assistant

## 2015-05-02 DIAGNOSIS — R05 Cough: Secondary | ICD-10-CM

## 2015-05-02 DIAGNOSIS — R059 Cough, unspecified: Secondary | ICD-10-CM

## 2015-06-08 ENCOUNTER — Other Ambulatory Visit: Payer: Self-pay | Admitting: Physician Assistant

## 2015-06-08 ENCOUNTER — Ambulatory Visit
Admission: RE | Admit: 2015-06-08 | Discharge: 2015-06-08 | Disposition: A | Discharge status: Home / Self Care | Payer: 59 | Source: Ambulatory Visit | Attending: Physician Assistant | Admitting: Physician Assistant

## 2015-06-08 DIAGNOSIS — J189 Pneumonia, unspecified organism: Secondary | ICD-10-CM

## 2015-09-11 ENCOUNTER — Encounter: Payer: Self-pay | Admitting: Internal Medicine

## 2015-12-06 ENCOUNTER — Encounter: Payer: Self-pay | Admitting: Gastroenterology

## 2017-01-20 ENCOUNTER — Encounter: Payer: Self-pay | Admitting: Gastroenterology

## 2017-02-14 ENCOUNTER — Encounter: Payer: Self-pay | Admitting: *Deleted

## 2017-02-18 ENCOUNTER — Encounter: Payer: Self-pay | Admitting: Gastroenterology

## 2017-02-18 ENCOUNTER — Ambulatory Visit (INDEPENDENT_AMBULATORY_CARE_PROVIDER_SITE_OTHER): Payer: 59 | Admitting: Gastroenterology

## 2017-02-18 VITALS — BP 120/74 | HR 72 | Ht 63.5 in | Wt 147.4 lb

## 2017-02-18 DIAGNOSIS — K649 Unspecified hemorrhoids: Secondary | ICD-10-CM | POA: Diagnosis not present

## 2017-02-18 DIAGNOSIS — K625 Hemorrhage of anus and rectum: Secondary | ICD-10-CM

## 2017-02-18 DIAGNOSIS — R140 Abdominal distension (gaseous): Secondary | ICD-10-CM

## 2017-02-18 DIAGNOSIS — K5900 Constipation, unspecified: Secondary | ICD-10-CM | POA: Diagnosis not present

## 2017-02-18 DIAGNOSIS — Z86010 Personal history of colonic polyps: Secondary | ICD-10-CM

## 2017-02-18 NOTE — Progress Notes (Signed)
Teresa Robbins    [id number]    26-Oct-1950  Primary Care Physician:Redmon, Enid Cutter  Referring Physician: [name], PA-C [address] [place], [place] [address]  Chief complaint: Hemorrhoids, rectal bleeding, bloating, constipation  HPI: 66 year old female here for evaluation with complaints of intermittent bright red blood per rectum, protruding hemorrhoids, constipation and excessive bloating. She was previously followed by Dr. [name] and was last seen in office 12/17/2011. She is currently using Miralax 1 capful daily with BM once or twice a week. She notices bright red blood per rectum after every bowel movement and every time she wipes. She also has sensation of increased pressure and fullness in the rectum. Patient has had multiple colonoscopies, most recent in March 2012 with removal of hyperplastic polyp. Prior to that in 2006 was normal. March 2001 with removal of 2 diminutive polyps one of which was tubular adenoma. Family history positive for polyps but no history of colon cancer.   Outpatient Encounter Prescriptions as of 02/18/2017  Medication Sig  . amLODipine (NORVASC) 10 MG tablet Take 10 mg by mouth daily.  Marland Kitchen aspirin 81 MG tablet Take 81 mg by mouth daily.  . Calcium Carbonate-Vitamin D (CALCIUM 600/VITAMIN D) 600-400 MG-UNIT per tablet Take 1 tablet by mouth daily.  . hydrochlorothiazide (HYDRODIURIL) 25 MG tablet Take 25 mg by mouth daily.  . irbesartan (AVAPRO) 300 MG tablet Take 300 mg by mouth at bedtime.  . Multiple Vitamins-Minerals (MULTIVITAMIN PO) Take by mouth daily.  Marland Kitchen PARoxetine (PAXIL) 10 MG tablet Take 10 mg by mouth every morning.  . [DISCONTINUED] estrogen, conjugated,-medroxyprogesterone (PREMPRO) 0.625-2.5 MG per tablet Take 1 tablet by mouth daily.   No facility-administered encounter medications on file as of 02/18/2017.     Allergies as of 02/18/2017 - Review Complete 02/18/2017  Allergen Reaction Noted  .  Septra [sulfamethoxazole-trimethoprim] Other (See Comments) 02/18/2017    Past Medical History:  Diagnosis Date  . Abdominal pain   . Chest pain   . Constipation   . Depression   . Diverticulosis   . Fainting   . Gallstones   . HLD (hyperlipidemia)   . Hx of adenomatous colonic polyps   . Hypercholesterolemia   . Hyperplastic colon polyp   . Hypertension   . IBS (irritable bowel syndrome)   . Obesity   . Retinopathy   . Weakness     Past Surgical History:  Procedure Laterality Date  . BREAST LUMPECTOMY Right 1997 - approximate  . BUNIONECTOMY Bilateral 1995 - approximate  . [place]   w/tubal ligation  . CHOLECYSTECTOMY    . MASTECTOMY Bilateral     Family History  Problem Relation Age of Onset  . Colon polyps Mother   . Depression Mother   . COPD Mother   . Colon polyps Father   . Prostate cancer Father   . Hypertension Father   . Breast cancer Daughter   . Leukemia Daughter   . Hypertension Daughter   . Breast cancer Paternal Aunt   . Breast cancer Cousin     breast - on mom's side  . Depression Brother   . Hypertension Brother   . Stroke Paternal Grandmother   . Hypertension Sister   . Hypertension Brother     Social History   Social History  . Marital status: Married    Spouse name: N/A  . Number of children: 3  . Years of education:  N/A   Occupational History  . Consulting civil engineer   Social History Main Topics  . Smoking status: Current Every Day Smoker  . Smokeless tobacco: Never Used  . Alcohol use No  . Drug use: No  . Sexual activity: Not on file   Other Topics Concern  . Not on file   Social History Narrative  . No narrative on file      Review of systems: Review of Systems  Constitutional: Negative for fever and chills.  HENT: Negative.   Eyes: Negative for blurred vision.  Respiratory: Negative for cough, shortness of breath and wheezing.   Cardiovascular: Negative for  chest pain and palpitations.  Gastrointestinal: as per HPI Genitourinary: Negative for dysuria, urgency, frequency and hematuria.  Musculoskeletal: Negative for myalgias, back pain and joint pain.  Skin: Negative for itching and rash.  Neurological: Negative for dizziness, tremors, focal weakness, seizures and loss of consciousness.  Endo/Heme/Allergies: Positive for seasonal allergies.  Psychiatric/Behavioral: Negative for depression, suicidal ideas and hallucinations.  All other systems reviewed and are negative.   Physical Exam: Vitals:   02/18/17 1439  BP: 120/74  Pulse: 72   Body mass index is 25.7 kg/m. Gen:      No acute distress HEENT:  EOMI, sclera anicteric Neck:     No masses; no thyromegaly Lungs:    Clear to auscultation bilaterally; normal respiratory effort CV:         Regular rate and rhythm; no murmurs Abd:      + bowel sounds; soft, non-tender; no palpable masses, no distension Ext:    No edema; adequate peripheral perfusion Skin:      Warm and dry; no rash Neuro: alert and oriented x 3 Psych: normal mood and affect Rectal exam: Normal anal sphincter tone, no anal fissure or external hemorrhoids Anoscopy: Grade 3 internal hemorrhoids in left lateral and right posterior, grade 2 internal hemorrhoids in the right anterior no active bleeding, normal dentate line, no visible nodules  Data Reviewed:  Reviewed labs, radiology imaging, old records and pertinent past GI work up   Assessment and Plan/Recommendations:  66 year old female with history of chronic constipation, symptomatic hemorrhoids and small-volume bright red blood per rectum Evidence of grade 2-3 internal hemorrhoids likely etiology of small-volume rectal bleeding We'll start Linzess  145 g daily, titrate based on response Benefiber 1 tablespoon 3 times daily with meals Increase dietary fluid and fiber intake We'll schedule for band ligation of hemorrhoids once constipation has improved Advised  patient to avoid excessive straining and sitting on toilet for prolonged periods of time Excessive bloating likely in the setting of severe constipation If no improvement despite laxatives, we'll consider anorectal manometry to evaluate for possible dyssynergia defecation  Colorectal cancer screening: Due for recall colonoscopy in March 2022 Diminutive tubular adenoma removed in 2001, subsequent colonoscopy in 2006 and 2012 with no adenomatous polyp   K. Denzil Magnuson , MD [phone] Mon-Fri 8a-5p [phone] after 5p, weekends, holidays  CC: Redmon, Noelle, PA-C

## 2017-02-18 NOTE — Patient Instructions (Addendum)
We will send linzess to your pharmacy  Use Benefiber 1 tablespoon three times a day with meals   Increase fluid intake 8-10 cups daily     Constipation, Adult Constipation is when a person:  Poops (has a bowel movement) fewer times in a week than normal.  Has a hard time pooping.  Has poop that is dry, hard, or bigger than normal. Follow these instructions at home: Eating and drinking    Eat foods that have a lot of fiber, such as:  Fresh fruits and vegetables.  Whole grains.  Beans.  Eat less of foods that are high in fat, low in fiber, or overly processed, such as:  [place] fries.  Hamburgers.  Cookies.  Candy.  Soda.  Drink enough fluid to keep your pee (urine) clear or pale yellow. General instructions   Exercise regularly or as told by your doctor.  Go to the restroom when you feel like you need to poop. Do not hold it in.  Take over-the-counter and prescription medicines only as told by your doctor. These include any fiber supplements.  Do pelvic floor retraining exercises, such as:  Doing deep breathing while relaxing your lower belly (abdomen).  Relaxing your pelvic floor while pooping.  Watch your condition for any changes.  Keep all follow-up visits as told by your doctor. This is important. Contact a doctor if:  You have pain that gets worse.  You have a fever.  You have not pooped for 4 days.  You throw up (vomit).  You are not hungry.  You lose weight.  You are bleeding from the anus.  You have thin, pencil-like poop (stool). Get help right away if:  You have a fever, and your symptoms suddenly get worse.  You leak poop or have blood in your poop.  Your belly feels hard or bigger than normal (is bloated).  You have very bad belly pain.  You feel dizzy or you faint. This information is not intended to replace advice given to you by your health care provider. Make sure you discuss any questions you have with your health  care provider. Document Released: 03/18/2008 Document Revised: 04/19/2016 Document Reviewed: 03/20/2016 Elsevier Interactive Patient Education  2017 [employer].

## 2017-02-20 ENCOUNTER — Telehealth: Payer: Self-pay | Admitting: Gastroenterology

## 2017-02-20 DIAGNOSIS — K649 Unspecified hemorrhoids: Secondary | ICD-10-CM | POA: Insufficient documentation

## 2017-02-20 DIAGNOSIS — K5900 Constipation, unspecified: Secondary | ICD-10-CM | POA: Insufficient documentation

## 2017-02-20 MED ORDER — LINACLOTIDE 72 MCG PO CAPS: 72.0000 ug | ORAL_CAPSULE | Freq: Every day | ORAL | 3 refills | Status: DC

## 2017-02-20 NOTE — Telephone Encounter (Signed)
Ok thanks 

## 2017-02-20 NOTE — Telephone Encounter (Signed)
Lower dose of linzess sent to pharmacy  72 mcg daily  FYI - Dr Silverio Decamp we gave pt 145 linzess samples in office, she requested a lower dose so I sent in 72 mcg to her pharmacy

## 2017-02-25 NOTE — Telephone Encounter (Signed)
Prior auth done on Linzess and approved until 08/24/2017

## 2017-04-09 ENCOUNTER — Ambulatory Visit: Payer: 59 | Admitting: Gastroenterology

## 2017-04-09 ENCOUNTER — Telehealth: Payer: Self-pay | Admitting: Gastroenterology

## 2017-04-09 NOTE — Telephone Encounter (Signed)
No Charge 

## 2017-04-14 ENCOUNTER — Other Ambulatory Visit: Payer: Self-pay

## 2017-04-14 MED ORDER — LINACLOTIDE 72 MCG PO CAPS: 72.0000 ug | ORAL_CAPSULE | Freq: Every day | ORAL | 1 refills | Status: DC

## 2017-08-25 ENCOUNTER — Other Ambulatory Visit: Payer: Self-pay | Admitting: Physician Assistant

## 2017-08-25 ENCOUNTER — Ambulatory Visit
Admission: RE | Admit: 2017-08-25 | Discharge: 2017-08-25 | Disposition: A | Discharge status: Home / Self Care | Payer: 59 | Source: Ambulatory Visit | Attending: Physician Assistant | Admitting: Physician Assistant

## 2017-08-25 DIAGNOSIS — R52 Pain, unspecified: Secondary | ICD-10-CM

## 2018-07-07 ENCOUNTER — Ambulatory Visit
Admission: RE | Admit: 2018-07-07 | Discharge: 2018-07-07 | Disposition: A | Discharge status: Home / Self Care | Payer: Managed Care, Other (non HMO) | Source: Ambulatory Visit | Attending: Physician Assistant | Admitting: Physician Assistant

## 2018-07-07 ENCOUNTER — Other Ambulatory Visit: Payer: Self-pay | Admitting: Physician Assistant

## 2018-07-07 DIAGNOSIS — R05 Cough: Secondary | ICD-10-CM

## 2018-07-07 DIAGNOSIS — R059 Cough, unspecified: Secondary | ICD-10-CM

## 2019-05-25 ENCOUNTER — Emergency Department (HOSPITAL_COMMUNITY): Payer: Medicare Other

## 2019-05-25 ENCOUNTER — Other Ambulatory Visit: Payer: Self-pay

## 2019-05-25 ENCOUNTER — Observation Stay (HOSPITAL_COMMUNITY)
Admission: EM | Admit: 2019-05-25 | Discharge: 2019-05-26 | Disposition: A | Discharge status: Home / Self Care | Payer: Medicare Other | Attending: Internal Medicine | Admitting: Internal Medicine

## 2019-05-25 ENCOUNTER — Encounter (HOSPITAL_COMMUNITY): Payer: Self-pay | Admitting: Emergency Medicine

## 2019-05-25 ENCOUNTER — Observation Stay (HOSPITAL_COMMUNITY): Payer: Medicare Other

## 2019-05-25 DIAGNOSIS — R55 Syncope and collapse: Secondary | ICD-10-CM | POA: Diagnosis present

## 2019-05-25 DIAGNOSIS — R270 Ataxia, unspecified: Secondary | ICD-10-CM | POA: Diagnosis not present

## 2019-05-25 DIAGNOSIS — Z7982 Long term (current) use of aspirin: Secondary | ICD-10-CM | POA: Insufficient documentation

## 2019-05-25 DIAGNOSIS — R260 Ataxic gait: Secondary | ICD-10-CM | POA: Insufficient documentation

## 2019-05-25 DIAGNOSIS — I10 Essential (primary) hypertension: Secondary | ICD-10-CM | POA: Diagnosis not present

## 2019-05-25 DIAGNOSIS — Z79899 Other long term (current) drug therapy: Secondary | ICD-10-CM | POA: Insufficient documentation

## 2019-05-25 DIAGNOSIS — Z20828 Contact with and (suspected) exposure to other viral communicable diseases: Secondary | ICD-10-CM | POA: Insufficient documentation

## 2019-05-25 DIAGNOSIS — F17200 Nicotine dependence, unspecified, uncomplicated: Secondary | ICD-10-CM | POA: Diagnosis not present

## 2019-05-25 DIAGNOSIS — R269 Unspecified abnormalities of gait and mobility: Secondary | ICD-10-CM

## 2019-05-25 LAB — COMPREHENSIVE METABOLIC PANEL
ALT: 18 U/L (ref 0–44)
AST: 21 U/L (ref 15–41)
Albumin: 4.1 g/dL (ref 3.5–5.0)
Alkaline Phosphatase: 62 U/L (ref 38–126)
Anion gap: 12 (ref 5–15)
BUN: 8 mg/dL (ref 8–23)
CO2: 24 mmol/L (ref 22–32)
Calcium: 9.2 mg/dL (ref 8.9–10.3)
Chloride: 96 mmol/L — ABNORMAL LOW (ref 98–111)
Creatinine, Ser: 0.7 mg/dL (ref 0.44–1.00)
GFR calc Af Amer: >60 mL/min (ref >60)
GFR calc non Af Amer: >60 mL/min (ref >60)
Glucose, Bld: 103 mg/dL — ABNORMAL HIGH (ref 70–99)
Potassium: 4.6 mmol/L (ref 3.5–5.1)
Sodium: 132 mmol/L — ABNORMAL LOW (ref 135–145)
Total Bilirubin: 0.7 mg/dL (ref 0.3–1.2)
Total Protein: 7.2 g/dL (ref 6.5–8.1)

## 2019-05-25 LAB — CBC
HCT: 42.7 % (ref 36.0–46.0)
HCT: 39.6 % (ref 36.0–46.0)
Hemoglobin: 14.3 g/dL (ref 12.0–15.0)
Hemoglobin: 13.5 g/dL (ref 12.0–15.0)
MCH: 32.4 pg (ref 26.0–34.0)
MCH: 32.5 pg (ref 26.0–34.0)
MCHC: 33.5 g/dL (ref 30.0–36.0)
MCHC: 34.1 g/dL (ref 30.0–36.0)
MCV: 96.6 fL (ref 80.0–100.0)
MCV: 95.2 fL (ref 80.0–100.0)
Platelets: 322 10*3/uL (ref 150–400)
Platelets: 294 10*3/uL (ref 150–400)
RBC: 4.42 MIL/uL (ref 3.87–5.11)
RBC: 4.16 MIL/uL (ref 3.87–5.11)
RDW: 12 % (ref 11.5–15.5)
RDW: 12 % (ref 11.5–15.5)
WBC: 6.9 10*3/uL (ref 4.0–10.5)
WBC: 6.4 10*3/uL (ref 4.0–10.5)
nRBC: 0 % (ref 0.0–0.2)
nRBC: 0 % (ref 0.0–0.2)

## 2019-05-25 LAB — I-STAT CHEM 8, ED
BUN: 8 mg/dL (ref 8–23)
Calcium, Ion: 1.07 mmol/L — ABNORMAL LOW (ref 1.15–1.40)
Chloride: 97 mmol/L — ABNORMAL LOW (ref 98–111)
Creatinine, Ser: 0.6 mg/dL (ref 0.44–1.00)
Glucose, Bld: 99 mg/dL (ref 70–99)
HCT: 44 % (ref 36.0–46.0)
Hemoglobin: 15 g/dL (ref 12.0–15.0)
Potassium: 4.2 mmol/L (ref 3.5–5.1)
Sodium: 130 mmol/L — ABNORMAL LOW (ref 135–145)
TCO2: 25 mmol/L (ref 22–32)

## 2019-05-25 LAB — PROTIME-INR
INR: 1 (ref 0.8–1.2)
Prothrombin Time: 12.7 seconds (ref 11.4–15.2)

## 2019-05-25 LAB — CREATININE, SERUM
Creatinine, Ser: 0.61 mg/dL (ref 0.44–1.00)
GFR calc Af Amer: >60 mL/min (ref >60)
GFR calc non Af Amer: >60 mL/min (ref >60)

## 2019-05-25 LAB — SARS CORONAVIRUS 2 (TAT 6-24 HRS): SARS Coronavirus 2: NEGATIVE

## 2019-05-25 LAB — DIFFERENTIAL
Abs Immature Granulocytes: 0.02 10*3/uL (ref 0.00–0.07)
Basophils Absolute: 0.1 10*3/uL (ref 0.0–0.1)
Basophils Relative: 1 %
Eosinophils Absolute: 0.2 10*3/uL (ref 0.0–0.5)
Eosinophils Relative: 2 %
Immature Granulocytes: 0 %
Lymphocytes Relative: 30 %
Lymphs Abs: 2.1 10*3/uL (ref 0.7–4.0)
Monocytes Absolute: 0.7 10*3/uL (ref 0.1–1.0)
Monocytes Relative: 10 %
Neutro Abs: 3.9 10*3/uL (ref 1.7–7.7)
Neutrophils Relative %: 57 %

## 2019-05-25 LAB — TROPONIN I (HIGH SENSITIVITY)
Troponin I (High Sensitivity): 3 ng/L (ref <18)
Troponin I (High Sensitivity): 3 ng/L (ref <18)

## 2019-05-25 LAB — APTT: aPTT: 33 seconds (ref 24–36)

## 2019-05-25 LAB — CBG MONITORING, ED: Glucose-Capillary: 85 mg/dL (ref 70–99)

## 2019-05-25 MED ORDER — SODIUM CHLORIDE 0.9 % IV SOLN: 250.0000 mL | INTRAVENOUS | Status: DC | PRN

## 2019-05-25 MED ORDER — ASPIRIN EC 81 MG PO TBEC
81.0000 mg | DELAYED_RELEASE_TABLET | ORAL | Status: DC
  Administered 2019-05-26: 81 mg via ORAL
  Filled 2019-05-25 (×3): qty 1

## 2019-05-25 MED ORDER — SODIUM CHLORIDE 0.9% FLUSH: 3.0000 mL | Freq: Two times a day (BID) | INTRAVENOUS | Status: DC

## 2019-05-25 MED ORDER — SODIUM CHLORIDE 0.9% FLUSH
3.0000 mL | Freq: Two times a day (BID) | INTRAVENOUS | Status: DC
  Administered 2019-05-26 (×2): 3 mL via INTRAVENOUS

## 2019-05-25 MED ORDER — LINACLOTIDE 72 MCG PO CAPS: 72.0000 ug | ORAL_CAPSULE | Freq: Every day | ORAL | Status: DC

## 2019-05-25 MED ORDER — LOSARTAN POTASSIUM 50 MG PO TABS
100.0000 mg | ORAL_TABLET | Freq: Every day | ORAL | Status: DC
  Administered 2019-05-26 (×2): 100 mg via ORAL
  Filled 2019-05-25 (×2): qty 2

## 2019-05-25 MED ORDER — SODIUM CHLORIDE 0.9 % IV BOLUS
1000.0000 mL | Freq: Once | INTRAVENOUS | Status: AC
  Administered 2019-05-25: 1000 mL via INTRAVENOUS

## 2019-05-25 MED ORDER — ENOXAPARIN SODIUM 40 MG/0.4ML ~~LOC~~ SOLN
40.0000 mg | SUBCUTANEOUS | Status: DC
  Administered 2019-05-26: 40 mg via SUBCUTANEOUS
  Filled 2019-05-25 (×2): qty 0.4

## 2019-05-25 MED ORDER — SODIUM CHLORIDE 0.9% FLUSH: 3.0000 mL | Freq: Once | INTRAVENOUS | Status: DC

## 2019-05-25 MED ORDER — SODIUM CHLORIDE 0.9% FLUSH: 3.0000 mL | INTRAVENOUS | Status: DC | PRN

## 2019-05-25 MED ORDER — AMLODIPINE BESYLATE 10 MG PO TABS
10.0000 mg | ORAL_TABLET | Freq: Every day | ORAL | Status: DC
  Administered 2019-05-26 (×2): 10 mg via ORAL
  Filled 2019-05-25 (×2): qty 1

## 2019-05-25 MED ORDER — ACETAMINOPHEN 325 MG PO TABS
650.0000 mg | ORAL_TABLET | Freq: Four times a day (QID) | ORAL | Status: DC | PRN
  Administered 2019-05-26: 650 mg via ORAL
  Filled 2019-05-25: qty 2

## 2019-05-25 MED ORDER — SENNA 8.6 MG PO TABS
1.0000 | ORAL_TABLET | Freq: Two times a day (BID) | ORAL | Status: DC
  Administered 2019-05-26: 8.6 mg via ORAL
  Filled 2019-05-25 (×2): qty 1

## 2019-05-25 MED ORDER — PAROXETINE HCL 10 MG PO TABS
10.0000 mg | ORAL_TABLET | Freq: Every day | ORAL | Status: DC
  Administered 2019-05-26: 10 mg via ORAL
  Filled 2019-05-25: qty 1

## 2019-05-25 MED ORDER — ACETAMINOPHEN 650 MG RE SUPP: 650.0000 mg | Freq: Four times a day (QID) | RECTAL | Status: DC | PRN

## 2019-05-25 MED ORDER — HYDROCHLOROTHIAZIDE 25 MG PO TABS
25.0000 mg | ORAL_TABLET | Freq: Every day | ORAL | Status: DC
  Filled 2019-05-25: qty 1

## 2019-05-25 NOTE — H&P (Signed)
History and Physical    Teresa Robbins KGM:[id number] DOB: [birth year] DOA: 05/25/2019  PCP: [name], PA-C (Confirm with patient/family/NH records and if not entered, this has to be entered at [facility] point of entry) Patient coming from: Home  I have personally briefly reviewed patient's old medical records in [place]  Chief Complaint: Episode of near syncope and ataxia  HPI: Teresa Robbins is a 68 y.o. female with medical history significant of hypertension, IBS, gallstones and a history of fainting.  The patient reports that this previous Friday she had an episode in the morning of sudden onset of near syncope.  She felt she was going to pass out, was able to move to her right and then against the refrigerator striking her head.  She denies any loss of consciousness.  On the day prior to admission the patient reports she had a sensation of being off balance.  She was able to ambulate.  She denies any focal weakness.  She denies any severe headache, change in vision or loss of sensation.  For these symptoms she presented to the [facility] emergency department   ED Course: Patient was hemodynamically stable.  On physical exam the emergency department doctor did walk the patient.  She seemed to be unsteady on her feet with no definitive gait abnormality.  CT scan of the head revealed the patient to have a 5 mm hypodense area in the internal capsule on the right.  Lab work was unremarkable.  Telephone call to neurology recommended moving ahead with an MRI scan of the brain and based upon the results they would consult if needed.  Patient is referred for observation admission for telemetry to rule out arrhythmia and for evaluation of possible CVA  Review of Systems: As per HPI otherwise 10 point review of systems negative.    Past Medical History:  Diagnosis Date  . Abdominal pain   . Chest pain   . Constipation   . Depression   . Diverticulosis   . Fainting   . Gallstones   . HLD  (hyperlipidemia)   . Hx of adenomatous colonic polyps 2001  . Hypercholesterolemia   . Hyperplastic colon polyp 2012  . Hypertension   . IBS (irritable bowel syndrome)   . Obesity   . Retinopathy   . Weakness     Past Surgical History:  Procedure Laterality Date  . BREAST LUMPECTOMY Right 1997 - approximate  . BUNIONECTOMY Bilateral 1995 - approximate  . [place]   w/tubal ligation  . CHOLECYSTECTOMY    . MASTECTOMY Bilateral      reports that she has been smoking. She has never used smokeless tobacco. She reports that she does not drink alcohol or use drugs.  Allergies  Allergen Reactions  . Morphine Itching  . Sulfamethoxazole-Trimethoprim Other (See Comments)    Weak/tired, "felt like I was walking around in a fog"     Family History  Problem Relation Age of Onset  . Colon polyps Mother   . Depression Mother   . COPD Mother   . Colon polyps Father   . Prostate cancer Father   . Hypertension Father   . Breast cancer Daughter   . Leukemia Daughter   . Hypertension Daughter   . Breast cancer Paternal Aunt   . Breast cancer Cousin        breast - on mom's side  . Depression Brother   . Hypertension Brother   . Stroke Paternal Grandmother   .  Hypertension Sister   . Hypertension Brother    Social history -patient is a widow after 42 years of marriage.  She has 4 children, 3 grandchildren and 1 grandchild on the way.  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  She does live alone.  Her family is nearby.  She is in close communication with her daughters  Prior to Admission medications   Medication Sig Start Date End Date Taking? Authorizing Provider  amLODipine (NORVASC) 10 MG tablet Take 10 mg by mouth daily.   Yes [provider]  aspirin 81 MG tablet Take 81 mg by mouth every other day.    Yes [provider]  Calcium Carb-Cholecalciferol (CALCIUM+D3 PO) Take 1 tablet by mouth daily with breakfast.   Yes  [provider]  ibandronate (BONIVA) 150 MG tablet Take 150 mg by mouth every 30 (thirty) days. Take in the morning with a full glass of water, on an empty stomach, and do not take anything else by mouth or lie down for the next 30 min.   Yes [provider]  losartan (COZAAR) 100 MG tablet Take 100 mg by mouth daily.   Yes [provider]  Multiple Vitamins-Minerals (MULTIVITAMIN PO) Take 1 tablet by mouth daily with breakfast.    Yes [provider]  PARoxetine (PAXIL) 10 MG tablet Take 10 mg by mouth every morning.   Yes [provider]  sodium chloride (V-R NASAL SPRAY SALINE) 0.65 % nasal spray Place 1-2 sprays into both nostrils as needed for congestion.   Yes [provider]  hydrochlorothiazide (HYDRODIURIL) 25 MG tablet Take 25 mg by mouth daily.    [provider]  linaclotide Rolan Lipa) 72 MCG capsule Take 1 capsule (72 mcg total) by mouth daily before breakfast. Patient not taking: Reported on 05/25/2019 04/14/17   Mauri Pole, MD    Physical Exam: Vitals:   05/25/19 1345 05/25/19 1400 05/25/19 1515 05/25/19 1645  BP:      Pulse:   77   Resp: 14 18 20 18   Temp:      TempSrc:      SpO2:   95%   Weight:      Height:        Constitutional: NAD, calm, comfortable Vitals:   05/25/19 1345 05/25/19 1400 05/25/19 1515 05/25/19 1645  BP:      Pulse:   77   Resp: 14 18 20 18   Temp:      TempSrc:      SpO2:   95%   Weight:      Height:       General appearance:  -Well-nourished well-developed woman in no acute distress who looks her stated age.  Eyes: PERRL, lids and conjunctivae normal ENMT: Mucous membranes are moist. Posterior pharynx clear of any exudate or lesions.Normal dentition.  Neck: normal, supple, no masses, no thyromegaly Respiratory: clear to auscultation bilaterally, no wheezing, no crackles. Normal respiratory effort. No accessory muscle use.  Cardiovascular: Regular rate and rhythm, no  murmurs / rubs / gallops. No extremity edema. 2+ pedal pulses. No carotid bruits.  Abdomen: no tenderness, no masses palpated. No hepatosplenomegaly. Bowel sounds positive.  Musculoskeletal: no clubbing / cyanosis. No joint deformity upper and lower extremities. Good ROM, no contractures. Normal muscle tone.  Skin: no rashes, lesions, ulcers. No induration Neurologic: CN 2-12 grossly intact. Sensation intact, . Strength 5/5 in all 4, normal hand strength.  Patient was walked by the emergency department physician  and this exam was not repeated. patient with no tremor, no dysdiadochokinesia Psychiatric: Normal judgment and insight. Alert and oriented x 3. Normal mood.     Labs on Admission: I have personally reviewed following labs and imaging studies  CBC: Recent Labs  Lab 05/25/19 1330 05/25/19 1338  WBC 6.9  --   NEUTROABS 3.9  --   HGB 14.3 15.0  HCT 42.7 44.0  MCV 96.6  --   PLT 322  --    Basic Metabolic Panel: Recent Labs  Lab 05/25/19 1330 05/25/19 1338  NA 132* 130*  K 4.6 4.2  CL 96* 97*  CO2 24  --   GLUCOSE 103* 99  BUN 8 8  CREATININE 0.70 0.60  CALCIUM 9.2  --    GFR: Estimated Creatinine Clearance: 66.6 mL/min (by C-G formula based on SCr of 0.6 mg/dL). Liver Function Tests: Recent Labs  Lab 05/25/19 1330  AST 21  ALT 18  ALKPHOS 62  BILITOT 0.7  PROT 7.2  ALBUMIN 4.1   No results for input(s): LIPASE, AMYLASE in the last 168 hours. No results for input(s): AMMONIA in the last 168 hours. Coagulation Profile: Recent Labs  Lab 05/25/19 1330  INR 1.0   Cardiac Enzymes: No results for input(s): CKTOTAL, CKMB, CKMBINDEX, TROPONINI in the last 168 hours. BNP (last 3 results) No results for input(s): PROBNP in the last 8760 hours. HbA1C: No results for input(s): HGBA1C in the last 72 hours. CBG: Recent Labs  Lab 05/25/19 1632  GLUCAP 85   Lipid Profile: No results for input(s): CHOL, HDL, LDLCALC, TRIG, CHOLHDL, LDLDIRECT in the last 72  hours. Thyroid Function Tests: No results for input(s): TSH, T4TOTAL, FREET4, T3FREE, THYROIDAB in the last 72 hours. Anemia Panel: No results for input(s): VITAMINB12, FOLATE, FERRITIN, TIBC, IRON, RETICCTPCT in the last 72 hours. Urine analysis: No results found for: COLORURINE, APPEARANCEUR, LABSPEC, [place], GLUCOSEU, HGBUR, BILIRUBINUR, KETONESUR, PROTEINUR, UROBILINOGEN, NITRITE, LEUKOCYTESUR  Radiological Exams on Admission: Ct Head Wo Contrast  Result Date: 05/25/2019 CLINICAL DATA:  Syncope, simple, normal neuro exam, possible stroke. Additional history: Syncopal episode today, hit right-side of head. EXAM: CT HEAD WITHOUT CONTRAST TECHNIQUE: Contiguous axial images were obtained from the base of the skull through the vertex without intravenous contrast. COMPARISON:  No pertinent prior studies available for comparison. FINDINGS: Brain: There is no evidence of acute intracranial hemorrhage. 5 mm hypodensity within the anterior limb of the right internal capsule (series 3, image 14). No evidence of intracranial mass. No midline shift or extra-axial collection. Cerebral volume is age appropriate. Vascular: No hyperdense vessel. Atherosclerotic calcification of the carotid artery siphons and vertebrobasilar system. Skull: No calvarial fracture. Sinuses/Orbits: Imaged globes and orbits demonstrate no acute abnormality. Mild mucosal thickening within the right sphenoid sinus. The imaged paranasal sinuses and mastoid air cells are otherwise well aerated. IMPRESSION: 5 mm hypodensity in the anterior limb of the right internal capsule. This may reflect an age-indeterminate lacunar infarct. Consider brain MRI for further evaluation if clinically warranted. Otherwise unremarkable CT appearance of the brain. Electronically Signed   By: Kellie Simmering   On: 05/25/2019 14:39    EKG: Independently reviewed.  Normal sinus rhythm with no abnormalities noted  Assessment/Plan Active Problems:   Syncope and  collapse   Ataxia    1.  Syncope with collapse-patient by her description had a near syncopal episode with no true loss of consciousness.  Denies any palpitations or rapid heart rate.  Has no prior history of any arrhythmia.  Plan telemetry observation to rule out arrhythmia as a cause of her near syncope  2.  Ataxia -patient with disequilibrium/ataxia but no focal weakness or gait disorder.  SPECT possible CVA with an millimeter hypodense area in the right internal capsule.  Would also be concerned about cerebellar infarct. Plan MRI brain ordered and pending  Neurology consult if needed  PT/OT evaluation and treatment.  3.  Smoking cessation -patient was encouraged to consider smoking cessation.  She was counseled in regards to identifying triggers and changing behavior patterns.  DVT prophylaxis: Lovenox (Lovenox/Heparin/SCD's/anticoagulated/None (if comfort care) Code Status: Code (Full/Partial (specify details) Family Communication: Patient understands working diagnosis and treatment plan.  She has been talking with her daughters on a regular basis and does not wish them to be called by this physician (Specify name, relationship. Do not write "discussed with patient". Specify tel # if discussed over the phone) Disposition Plan: Home in 24 to 48 hours (specify when and where you expect patient to be discharged) Consults called: Neurology (with names) Admission status: Telemetry observation (inpatient / obs / tele / medical floor / SDU)   Adella Hare MD [facility] Pager [phone]  If 7PM-7AM, please contact night-coverage [email or web address] Password [facility]  05/25/2019, 5:28 PM

## 2019-05-25 NOTE — ED Notes (Signed)
ED TO INPATIENT HANDOFF REPORT  ED Nurse Name and Phone #: Shirleymae Hauth  S Name/Age/Gender Teresa Robbins 68 y.o. female Room/Bed: 039C/039C  Code Status   Code Status: Full Code  Home/SNF/Other Dc home AO x4 I  Triage Complete: Triage complete  Chief Complaint unsteady gait  Triage Note Pt comes POV with complaints of unsteady gait since Friday morning around 0830. Reports a syncopal episode the same day hitting her head. Takes a baby ASA every other day. Denies weakness in extremities but states she feels "off with sudden moves and turns." Grips equal and speech clear no droop noted at triage.    Allergies Allergies  Allergen Reactions  . Morphine Itching  . Sulfamethoxazole-Trimethoprim Other (See Comments)    Weak/tired, "felt like I was walking around in a fog"     Level of Care/Admitting Diagnosis ED Disposition    ED Disposition Condition [place]: [place] [[id number]]  Level of Care: Telemetry Medical [104]  I expect the patient will be discharged within 24 hours: No (not a candidate for 5C-Observation unit)  Covid Evaluation: Asymptomatic Screening Protocol (No Symptoms)  Diagnosis: Syncope and collapse [780.2.ICD-9-CM]  Admitting Physician: Neena Rhymes [[id number]]  Attending Physician: Adella Hare E [[id number]]  PT Class (Do Not Modify): Observation [104]  PT Acc Code (Do Not Modify): Observation [10022]       B Medical/Surgery History Past Medical History:  Diagnosis Date  . Abdominal pain   . Chest pain   . Constipation   . Depression   . Diverticulosis   . Fainting   . Gallstones   . HLD (hyperlipidemia)   . Hx of adenomatous colonic polyps 2001  . Hypercholesterolemia   . Hyperplastic colon polyp 2012  . Hypertension   . IBS (irritable bowel syndrome)   . Obesity   . Retinopathy   . Weakness    Past Surgical History:  Procedure Laterality Date  . BREAST LUMPECTOMY Right 1997 - approximate  .  BUNIONECTOMY Bilateral 1995 - approximate  . [place]   w/tubal ligation  . CHOLECYSTECTOMY    . MASTECTOMY Bilateral      A IV Location/Drains/Wounds Patient Lines/Drains/Airways Status   Active Line/Drains/Airways    Name:   Placement date:   Placement time:   Site:   Days:   Peripheral IV 05/25/19 Right Antecubital   05/25/19    1507    Antecubital   less than 1          Intake/Output Last 24 hours No intake or output data in the 24 hours ending 05/25/19 2113  Labs/Imaging Results for orders placed or performed during the hospital encounter of 05/25/19 (from the past 48 hour(s))  Protime-INR     Status: None   Collection Time: 05/25/19  1:30 PM  Result Value Ref Range   Prothrombin Time 12.7 11.4 - 15.2 seconds   INR 1.0 0.8 - 1.2    Comment: (NOTE) INR goal varies based on device and disease states. Performed at [facility], [place] [address], [place], [place] [address]   APTT     Status: None   Collection Time: 05/25/19  1:30 PM  Result Value Ref Range   aPTT 33 24 - 36 seconds    Comment: Performed at [facility] [address], [place], [place] [address]  CBC     Status: None   Collection Time: 05/25/19  1:30 PM  Result  Value Ref Range   WBC 6.9 4.0 - 10.5 K/uL   RBC 4.42 3.87 - 5.11 MIL/uL   Hemoglobin 14.3 12.0 - 15.0 g/dL   HCT 42.7 36.0 - 46.0 %   MCV 96.6 80.0 - 100.0 fL   MCH 32.4 26.0 - 34.0 pg   MCHC 33.5 30.0 - 36.0 g/dL   RDW 12.0 11.5 - 15.5 %   Platelets 322 150 - 400 K/uL   nRBC 0.0 0.0 - 0.2 %    Comment: Performed at [facility], [place] [address] [place], [place] [address]  Differential     Status: None   Collection Time: 05/25/19  1:30 PM  Result Value Ref Range   Neutrophils Relative % 57 %   Neutro Abs 3.9 1.7 - 7.7 K/uL   Lymphocytes Relative 30 %   Lymphs Abs 2.1 0.7 - 4.0 K/uL   Monocytes Relative 10 %   Monocytes Absolute 0.7 0.1 - 1.0 K/uL   Eosinophils Relative 2 %   Eosinophils Absolute 0.2  0.0 - 0.5 K/uL   Basophils Relative 1 %   Basophils Absolute 0.1 0.0 - 0.1 K/uL   Immature Granulocytes 0 %   Abs Immature Granulocytes 0.02 0.00 - 0.07 K/uL    Comment: Performed at [facility] [address], [place], [place] [address]  Comprehensive metabolic panel     Status: Abnormal   Collection Time: 05/25/19  1:30 PM  Result Value Ref Range   Sodium 132 (L) 135 - 145 mmol/L   Potassium 4.6 3.5 - 5.1 mmol/L   Chloride 96 (L) 98 - 111 mmol/L   CO2 24 22 - 32 mmol/L   Glucose, Bld 103 (H) 70 - 99 mg/dL   BUN 8 8 - 23 mg/dL   Creatinine, Ser 0.70 0.44 - 1.00 mg/dL   Calcium 9.2 8.9 - 10.3 mg/dL   Total Protein 7.2 6.5 - 8.1 g/dL   Albumin 4.1 3.5 - 5.0 g/dL   AST 21 15 - 41 U/L   ALT 18 0 - 44 U/L   Alkaline Phosphatase 62 38 - 126 U/L   Total Bilirubin 0.7 0.3 - 1.2 mg/dL   GFR calc non Af Amer >60 >60 mL/min   GFR calc Af Amer >60 >60 mL/min   Anion gap 12 5 - 15    Comment: Performed at [facility], [place] [address], [place], [place] [address]  Troponin I (High Sensitivity)     Status: None   Collection Time: 05/25/19  1:30 PM  Result Value Ref Range   Troponin I (High Sensitivity) 3 <18 ng/L    Comment: (NOTE) Elevated high sensitivity troponin I (hsTnI) values and significant  changes across serial measurements may suggest ACS but many other  chronic and acute conditions are known to elevate hsTnI results.  Refer to the "Links" section for chest pain algorithms and additional  guidance. Performed at [facility], [place] [address], [place], [place] [address]   I-stat chem 8, ED     Status: Abnormal   Collection Time: 05/25/19  1:38 PM  Result Value Ref Range   Sodium 130 (L) 135 - 145 mmol/L   Potassium 4.2 3.5 - 5.1 mmol/L   Chloride 97 (L) 98 - 111 mmol/L   BUN 8 8 - 23 mg/dL   Creatinine, Ser 0.60 0.44 - 1.00 mg/dL   Glucose, Bld 99 70 - 99 mg/dL   Calcium, Ion 1.07 (L) 1.15 - 1.40  mmol/L   TCO2 25 22 - 32 mmol/L   Hemoglobin 15.0 12.0 -  15.0 g/dL   HCT 44.0 36.0 - 46.0 %  Troponin I (High Sensitivity)     Status: None   Collection Time: 05/25/19  4:10 PM  Result Value Ref Range   Troponin I (High Sensitivity) 3 <18 ng/L    Comment: (NOTE) Elevated high sensitivity troponin I (hsTnI) values and significant  changes across serial measurements may suggest ACS but many other  chronic and acute conditions are known to elevate hsTnI results.  Refer to the "Links" section for chest pain algorithms and additional  guidance. Performed at [facility], [place] [address] [place], [place] [address]   CBG monitoring, ED     Status: None   Collection Time: 05/25/19  4:32 PM  Result Value Ref Range   Glucose-Capillary 85 70 - 99 mg/dL  CBC     Status: None   Collection Time: 05/25/19  5:21 PM  Result Value Ref Range   WBC 6.4 4.0 - 10.5 K/uL   RBC 4.16 3.87 - 5.11 MIL/uL   Hemoglobin 13.5 12.0 - 15.0 g/dL   HCT 39.6 36.0 - 46.0 %   MCV 95.2 80.0 - 100.0 fL   MCH 32.5 26.0 - 34.0 pg   MCHC 34.1 30.0 - 36.0 g/dL   RDW 12.0 11.5 - 15.5 %   Platelets 294 150 - 400 K/uL   nRBC 0.0 0.0 - 0.2 %    Comment: Performed at [facility], [place] [address] [place], [place] [address]  Creatinine, serum     Status: None   Collection Time: 05/25/19  5:21 PM  Result Value Ref Range   Creatinine, Ser 0.61 0.44 - 1.00 mg/dL   GFR calc non Af Amer >60 >60 mL/min   GFR calc Af Amer >60 >60 mL/min    Comment: Performed at [facility] [address], [place], [place] [address]   Ct Head Wo Contrast  Result Date: 05/25/2019 CLINICAL DATA:  Syncope, simple, normal neuro exam, possible stroke. Additional history: Syncopal episode today, hit right-side of head. EXAM: CT HEAD WITHOUT CONTRAST TECHNIQUE: Contiguous axial images were obtained from the base of the skull through the vertex without intravenous contrast. COMPARISON:  No pertinent prior studies available for comparison. FINDINGS: Brain: There is no evidence of acute  intracranial hemorrhage. 5 mm hypodensity within the anterior limb of the right internal capsule (series 3, image 14). No evidence of intracranial mass. No midline shift or extra-axial collection. Cerebral volume is age appropriate. Vascular: No hyperdense vessel. Atherosclerotic calcification of the carotid artery siphons and vertebrobasilar system. Skull: No calvarial fracture. Sinuses/Orbits: Imaged globes and orbits demonstrate no acute abnormality. Mild mucosal thickening within the right sphenoid sinus. The imaged paranasal sinuses and mastoid air cells are otherwise well aerated. IMPRESSION: 5 mm hypodensity in the anterior limb of the right internal capsule. This may reflect an age-indeterminate lacunar infarct. Consider brain MRI for further evaluation if clinically warranted. Otherwise unremarkable CT appearance of the brain. Electronically Signed   By: Kellie Simmering   On: 05/25/2019 14:39   Mr Brain Wo Contrast (neuro Protocol)  Result Date: 05/25/2019 CLINICAL DATA:  Near syncope. EXAM: MRI HEAD WITHOUT CONTRAST TECHNIQUE: Multiplanar, multiecho pulse sequences of the brain and surrounding structures were obtained without intravenous contrast. COMPARISON:  Head CT 05/25/2019 FINDINGS: Brain: There is no evidence of acute infarct, intracranial hemorrhage, mass, midline shift, or extra-axial fluid collection.  The ventricles and sulci are normal. Scattered small foci of T2 hyperintensity in the juxtacortical and deep cerebral white matter bilaterally and in the pons are mildly advanced for age. Vascular: Major intracranial vascular flow voids are preserved. Skull and upper cervical spine: Unremarkable bone marrow signal. Sinuses/Orbits: Unremarkable orbits. Right sphenoid sinus mucosal thickening and/or fluid. Clear mastoid air cells. Other: None. IMPRESSION: 1. No acute intracranial abnormality. 2. Mildly age advanced cerebral white matter T2 signal changes, nonspecific though most often seen with  chronic small vessel ischemia. Electronically Signed   By: Logan Bores M.D.   On: 05/25/2019 18:34    Pending Labs Unresulted Labs (From admission, onward)    Start     Ordered   06/01/19 0500  Creatinine, serum  (enoxaparin (LOVENOX)    CrCl >/= 30 ml/min)  Weekly,   R    Comments: while on enoxaparin therapy    05/25/19 1626   05/26/19 2353  Basic metabolic panel  Tomorrow morning,   R     05/25/19 1626   05/25/19 1622  HIV antibody (Routine Testing)  Once,   STAT     05/25/19 1626   05/25/19 1500  SARS CORONAVIRUS 2 Nasal Swab Aptima Multi Swab  (Asymptomatic/Tier 2 Patients Labs)  Once,   STAT    Question Answer Comment  Is this test for diagnosis or screening Screening   Symptomatic for COVID-19 as defined by CDC No   Hospitalized for COVID-19 No   Admitted to ICU for COVID-19 No   Previously tested for COVID-19 No   Resident in a congregate (group) care setting No   Employed in healthcare setting No   Pregnant No      05/25/19 1500          Vitals/Pain Today's Vitals   05/25/19 1515 05/25/19 1645 05/25/19 1918 05/25/19 2109  BP:    136/87  Pulse: 77   80  Resp: 20 18  16   Temp:      TempSrc:      SpO2: 95%   95%  Weight:      Height:      PainSc:   0-No pain 0-No pain    Isolation Precautions No active isolations  Medications Medications  sodium chloride flush (NS) 0.9 % injection 3 mL (3 mLs Intravenous Not Given 05/25/19 2106)  aspirin EC tablet 81 mg (has no administration in time range)  amLODipine (NORVASC) tablet 10 mg (has no administration in time range)  hydrochlorothiazide (HYDRODIURIL) tablet 25 mg (has no administration in time range)  losartan (COZAAR) tablet 100 mg (has no administration in time range)  PARoxetine (PAXIL) tablet 10 mg (has no administration in time range)  sodium chloride flush (NS) 0.9 % injection 3 mL (has no administration in time range)  enoxaparin (LOVENOX) injection 40 mg (has no administration in time range)  sodium  chloride flush (NS) 0.9 % injection 3 mL (has no administration in time range)  sodium chloride flush (NS) 0.9 % injection 3 mL (has no administration in time range)  0.9 %  sodium chloride infusion (has no administration in time range)  acetaminophen (TYLENOL) tablet 650 mg (has no administration in time range)    Or  acetaminophen (TYLENOL) suppository 650 mg (has no administration in time range)  senna (SENOKOT) tablet 8.6 mg (has no administration in time range)  sodium chloride 0.9 % bolus 1,000 mL (1,000 mLs Intravenous New Bag/Given 05/25/19 1507)    Mobility One assist Moderate fall risk  Focused Assessments AO x 4, unsteady gait, SR on the monitor, no skin problem.   R Recommendations: See Admitting Provider Note  Report given to:   Additional Notes:

## 2019-05-25 NOTE — ED Notes (Signed)
Pt returned from CT °

## 2019-05-25 NOTE — ED Triage Notes (Signed)
Pt comes POV with complaints of unsteady gait since Friday morning around 0830. Reports a syncopal episode the same day hitting her head. Takes a baby ASA every other day. Denies weakness in extremities but states she feels "off with sudden moves and turns." Grips equal and speech clear no droop noted at triage.

## 2019-05-25 NOTE — ED Provider Notes (Signed)
[facility] EMERGENCY DEPARTMENT Provider Note   CSN: [id number] Arrival date & time: 05/25/19  1258    History   Chief Complaint Chief Complaint  Patient presents with  . Near Syncope  . Gait Problem    HPI Teresa Robbins is a 68 y.o. female.     The history is provided by the patient.  Loss of Consciousness Episode history:  Single Most recent episode:  More than 2 days ago Progression:  Resolved Chronicity:  New Context: normal activity   Witnessed: no   Relieved by:  Nothing Worsened by:  Nothing Associated symptoms: dizziness   Associated symptoms: no chest pain, no fever, no headaches, no palpitations, no seizures, no shortness of breath, no vomiting and no weakness   Risk factors: no coronary artery disease and no seizures     Past Medical History:  Diagnosis Date  . Abdominal pain   . Chest pain   . Constipation   . Depression   . Diverticulosis   . Fainting   . Gallstones   . HLD (hyperlipidemia)   . Hx of adenomatous colonic polyps 2001  . Hypercholesterolemia   . Hyperplastic colon polyp 2012  . Hypertension   . IBS (irritable bowel syndrome)   . Obesity   . Retinopathy   . Weakness     Patient Active Problem List   Diagnosis Date Noted  . Hemorrhoids 02/20/2017  . Constipation 02/20/2017  . Gallstones 10/16/2012    Past Surgical History:  Procedure Laterality Date  . BREAST LUMPECTOMY Right 1997 - approximate  . BUNIONECTOMY Bilateral 1995 - approximate  . [place]   w/tubal ligation  . CHOLECYSTECTOMY    . MASTECTOMY Bilateral      OB History   No obstetric history on file.      Home Medications    Prior to Admission medications   Medication Sig Start Date End Date Taking? Authorizing Provider  amLODipine (NORVASC) 10 MG tablet Take 10 mg by mouth daily.   Yes [provider]  aspirin 81 MG tablet Take 81 mg by mouth every other day.    Yes [provider]  Calcium  Carb-Cholecalciferol (CALCIUM+D3 PO) Take 1 tablet by mouth daily with breakfast.   Yes [provider]  ibandronate (BONIVA) 150 MG tablet Take 150 mg by mouth every 30 (thirty) days. Take in the morning with a full glass of water, on an empty stomach, and do not take anything else by mouth or lie down for the next 30 min.   Yes [provider]  losartan (COZAAR) 100 MG tablet Take 100 mg by mouth daily.   Yes [provider]  Multiple Vitamins-Minerals (MULTIVITAMIN PO) Take 1 tablet by mouth daily with breakfast.    Yes [provider]  PARoxetine (PAXIL) 10 MG tablet Take 10 mg by mouth every morning.   Yes [provider]  sodium chloride (V-R NASAL SPRAY SALINE) 0.65 % nasal spray Place 1-2 sprays into both nostrils as needed for congestion.   Yes [provider]  hydrochlorothiazide (HYDRODIURIL) 25 MG tablet Take 25 mg by mouth daily.    [provider]  linaclotide Rolan Lipa) 72 MCG capsule Take 1 capsule (72 mcg total) by mouth daily before breakfast. Patient not taking: Reported on 05/25/2019 04/14/17   Mauri Pole, MD    Family History Family History  Problem Relation Age of Onset  . Colon polyps Mother   . Depression Mother   .  COPD Mother   . Colon polyps Father   . Prostate cancer Father   . Hypertension Father   . Breast cancer Daughter   . Leukemia Daughter   . Hypertension Daughter   . Breast cancer Paternal Aunt   . Breast cancer Cousin        breast - on mom's side  . Depression Brother   . Hypertension Brother   . Stroke Paternal Grandmother   . Hypertension Sister   . Hypertension Brother     Social History Social History   Tobacco Use  . Smoking status: Current Every Day Smoker  . Smokeless tobacco: Never Used  Substance Use Topics  . Alcohol use: No  . Drug use: No     Allergies   Morphine and Sulfamethoxazole-trimethoprim   Review of Systems Review of Systems  Constitutional:  Negative for chills and fever.  HENT: Negative for congestion, ear pain, sneezing and sore throat.   Eyes: Negative for pain and visual disturbance.  Respiratory: Negative for cough and shortness of breath.   Cardiovascular: Positive for syncope. Negative for chest pain and palpitations.  Gastrointestinal: Negative for abdominal pain and vomiting.  Genitourinary: Negative for dysuria and hematuria.  Musculoskeletal: Positive for gait problem. Negative for arthralgias, back pain, neck pain and neck stiffness.  Skin: Negative for color change, rash and wound.  Neurological: Positive for dizziness and light-headedness. Negative for tremors, seizures, syncope, facial asymmetry, speech difficulty, weakness, numbness and headaches.  All other systems reviewed and are negative.    Physical Exam Updated Vital Signs  ED Triage Vitals  Enc Vitals Group     BP 05/25/19 1336 (!) 148/96     Pulse Rate 05/25/19 1336 81     Resp 05/25/19 1336 15     Temp 05/25/19 1340 98 F (36.7 C)     Temp Source 05/25/19 1340 Oral     SpO2 05/25/19 1336 95 %     Weight 05/25/19 1338 165 lb (74.8 kg)     Height 05/25/19 1338 5\' 4"  (1.626 m)     Head Circumference --      Peak Flow --      Pain Score 05/25/19 1319 0     Pain Loc --      Pain Edu? --      Excl. in [place]? --     Physical Exam Vitals signs and nursing note reviewed.  Constitutional:      General: She is not in acute distress.    Appearance: She is well-developed. She is not ill-appearing.  HENT:     Head: Normocephalic and atraumatic.     Nose: Nose normal.     Mouth/Throat:     Mouth: Mucous membranes are moist.  Eyes:     Extraocular Movements: Extraocular movements intact.     Conjunctiva/sclera: Conjunctivae normal.     Pupils: Pupils are equal, round, and reactive to light.  Neck:     Musculoskeletal: Normal range of motion and neck supple.  Cardiovascular:     Rate and Rhythm: Normal rate and regular rhythm.     Pulses: Normal  pulses.     Heart sounds: Normal heart sounds. No murmur.  Pulmonary:     Effort: Pulmonary effort is normal. No respiratory distress.     Breath sounds: Normal breath sounds.  Abdominal:     Palpations: Abdomen is soft.     Tenderness: There is no abdominal tenderness.  Musculoskeletal: Normal range of motion.  General: No swelling or tenderness.  Skin:    General: Skin is warm and dry.  Neurological:     General: No focal deficit present.     Mental Status: She is alert and oriented to person, place, and time.     Cranial Nerves: No cranial nerve deficit.     Sensory: No sensory deficit.     Motor: No weakness.     Gait: Gait abnormal (slightly wide based gait).     Comments: 5+ out of 5 strength throughout, normal finger-to-nose finger, normal rapid arm movements, normal heel-to-shin, normal speech, normal sensation      ED Treatments / Results  Labs (all labs ordered are listed, but only abnormal results are displayed) Labs Reviewed  COMPREHENSIVE METABOLIC PANEL - Abnormal; Notable for the following components:      Result Value   Sodium 132 (*)    Chloride 96 (*)    Glucose, Bld 103 (*)    All other components within normal limits  I-STAT CHEM 8, ED - Abnormal; Notable for the following components:   Sodium 130 (*)    Chloride 97 (*)    Calcium, Ion 1.07 (*)    All other components within normal limits  SARS CORONAVIRUS 2  PROTIME-INR  APTT  CBC  DIFFERENTIAL  CBG MONITORING, ED  TROPONIN I (HIGH SENSITIVITY)  TROPONIN I (HIGH SENSITIVITY)    EKG EKG Interpretation  Date/Time:  Tuesday May 25 2019 13:20:53 EDT Ventricular Rate:  82 PR Interval:  134 QRS Duration: 84 QT Interval:  394 QTC Calculation: 460 R Axis:   80 Text Interpretation:  Normal sinus rhythm Low voltage QRS Borderline ECG Confirmed by Lennice Sites [phone]) on 05/25/2019 1:39:46 PM   Radiology Ct Head Wo Contrast  Result Date: 05/25/2019 CLINICAL DATA:  Syncope, simple,  normal neuro exam, possible stroke. Additional history: Syncopal episode today, hit right-side of head. EXAM: CT HEAD WITHOUT CONTRAST TECHNIQUE: Contiguous axial images were obtained from the base of the skull through the vertex without intravenous contrast. COMPARISON:  No pertinent prior studies available for comparison. FINDINGS: Brain: There is no evidence of acute intracranial hemorrhage. 5 mm hypodensity within the anterior limb of the right internal capsule (series 3, image 14). No evidence of intracranial mass. No midline shift or extra-axial collection. Cerebral volume is age appropriate. Vascular: No hyperdense vessel. Atherosclerotic calcification of the carotid artery siphons and vertebrobasilar system. Skull: No calvarial fracture. Sinuses/Orbits: Imaged globes and orbits demonstrate no acute abnormality. Mild mucosal thickening within the right sphenoid sinus. The imaged paranasal sinuses and mastoid air cells are otherwise well aerated. IMPRESSION: 5 mm hypodensity in the anterior limb of the right internal capsule. This may reflect an age-indeterminate lacunar infarct. Consider brain MRI for further evaluation if clinically warranted. Otherwise unremarkable CT appearance of the brain. Electronically Signed   By: Kellie Simmering   On: 05/25/2019 14:39    Procedures Procedures (including critical care time)  Medications Ordered in ED Medications  sodium chloride flush (NS) 0.9 % injection 3 mL (has no administration in time range)  sodium chloride 0.9 % bolus 1,000 mL (1,000 mLs Intravenous New Bag/Given 05/25/19 1507)     Initial Impression / Assessment and Plan / ED Course  I have reviewed the triage vital signs and the nursing notes.  Pertinent labs & imaging results that were available during my care of the patient were reviewed by me and considered in my medical decision making (see chart for details).  LUNDYNN COHOON is a 68 year old female with history of hypertension, high  cholesterol who presents to the ED with difficulty with ambulation, dizziness.  Patient with normal vitals.  No fever.  Patient states that several days ago she had unprovoked syncopal episode.  Ever since that event she has had some intermittent dizziness, difficulty with ambulation.  She has not wanted to come to the emergency department for an evaluation until she talk to her primary care doctor today who recommended that she come.  EKG shows sinus rhythm.  No ischemic changes.  No signs of arrhythmia.  Patient appears to be neurologically intact.  Cerebellar signs appear within normal limits.  When she ambulates she does seem to have some difficulty but no true ataxia.  She does not have any severe dizziness, nausea, vomiting.  Patient overall has concerning story for possible stroke versus arrhythmia versus electrolyte issue.  Does not appear to be consistent with a peripheral vertigo.  CT scan of the head showed possibly an age-indeterminate lacunar infarct.  Lab work otherwise unremarkable.  No significant anemia, electrolyte abnormality, kidney injury, leukocytosis.  I talked with neurology who recommends MRI.  However, I believe patient also needs admission for syncope work-up given that she had an unprovoked syncopal event several days ago.  I believe that it is possible the patient has had a stroke and the symptoms could be secondary to a stroke but I also think that she could have had an arrhythmic process over the last several days.  Believe she would benefit from telemetry may be echocardiogram.  I have ordered an MRI.  Patient to be admitted to medicine for further stroke/syncope work-up.  Hemodynamically stable throughout my care.  Coronavirus test has been ordered.  This chart was dictated using voice recognition software.  Despite best efforts to proofread,  errors can occur which can change the documentation meaning.    Final Clinical Impressions(s) / ED Diagnoses   Final diagnoses:  Gait  abnormality  Syncope and collapse    ED Discharge Orders    None       Lennice Sites, DO 05/25/19 1608

## 2019-05-26 DIAGNOSIS — R269 Unspecified abnormalities of gait and mobility: Secondary | ICD-10-CM | POA: Diagnosis not present

## 2019-05-26 LAB — BASIC METABOLIC PANEL
Anion gap: 11 (ref 5–15)
BUN: 7 mg/dL — ABNORMAL LOW (ref 8–23)
CO2: 20 mmol/L — ABNORMAL LOW (ref 22–32)
Calcium: 8.4 mg/dL — ABNORMAL LOW (ref 8.9–10.3)
Chloride: 101 mmol/L (ref 98–111)
Creatinine, Ser: 0.66 mg/dL (ref 0.44–1.00)
GFR calc Af Amer: >60 mL/min (ref >60)
GFR calc non Af Amer: >60 mL/min (ref >60)
Glucose, Bld: 90 mg/dL (ref 70–99)
Potassium: 3.5 mmol/L (ref 3.5–5.1)
Sodium: 132 mmol/L — ABNORMAL LOW (ref 135–145)

## 2019-05-26 LAB — HIV ANTIBODY (ROUTINE TESTING W REFLEX): HIV Screen 4th Generation wRfx: NONREACTIVE

## 2019-05-26 NOTE — Progress Notes (Addendum)
PT Cancellation Note and Discharge  Patient Details Name: Teresa Robbins MRN: [id number] DOB: 09-01-1951   Cancelled Treatment:    Reason Eval/Treat Not Completed: PT screened, no needs identified, will sign off. Discussed pt case with OT who states that pt is currently functioning at a modified independent level and scored 23/24 on the DGI. PT will sign off at this time. If needs change, please reconsult.    Thelma Comp 05/26/2019, 10:16 AM   Rolinda Roan, PT, DPT Acute Rehabilitation Services Pager: [phone] Office: [phone]

## 2019-05-26 NOTE — Evaluation (Signed)
Occupational Therapy Evaluation Patient Details Name: Teresa Robbins MRN: [id number] DOB: 10-Dec-1950 Today's Date: 05/26/2019    History of Present Illness Pt is a 68 yo female s/p fall hitting head from syncope. MRI: no acute events, age advanced cerebral white matter. PMHx: HTN, h/o fainting   Clinical Impression   Pt PTA: living alone and independent in all aspects of mobility and self care. Pt currently with no focal deficits. Pt reports no dizziness with positional change. Pt performing mobility with 23/24 dynamic gait index with no significant deficits noted during ambulation with no AD. Pt good balance overall and able to retrieve item from ground. Pt does not require continued OT skilled services. No OT follow-up required. Pt appears to be at her baseline functional level. OT signing off.     Follow Up Recommendations  No OT follow up    Equipment Recommendations  None recommended by OT    Recommendations for Other Services       Precautions / Restrictions Precautions Precautions: None Restrictions Weight Bearing Restrictions: No      Mobility Bed Mobility Overal bed mobility: Modified Independent                Transfers Overall transfer level: Modified independent                    Balance Overall balance assessment: Modified Independent                               Standardized Balance Assessment Standardized Balance Assessment : Dynamic Gait Index   Dynamic Gait Index Level Surface: Normal Change in Gait Speed: Normal Gait with Horizontal Head Turns: Normal Gait with Vertical Head Turns: Normal Gait and Pivot Turn: Mild Impairment Step Over Obstacle: Normal Step Around Obstacles: Normal Steps: Normal Total Score: 23     ADL either performed or assessed with clinical judgement   ADL Overall ADL's : Modified independent                                       General ADL Comments: no physical assist  required. Pt with no LOB episodes.     Vision Baseline Vision/History: Wears glasses Wears Glasses: At all times Patient Visual Report: No change from baseline Vision Assessment?: No apparent visual deficits     Perception     Praxis      Pertinent Vitals/Pain Pain Assessment: No/denies pain     Hand Dominance Right   Extremity/Trunk Assessment Upper Extremity Assessment Upper Extremity Assessment: Overall WFL for tasks assessed   Lower Extremity Assessment Lower Extremity Assessment: Overall WFL for tasks assessed   Cervical / Trunk Assessment Cervical / Trunk Assessment: Normal   Communication Communication Communication: No difficulties   Cognition Arousal/Alertness: Awake/alert Behavior During Therapy: WFL for tasks assessed/performed Overall Cognitive Status: Within Functional Limits for tasks assessed                                     General Comments  pt able to bend over to retrieve item from floor.    Exercises     Shoulder Instructions      Home Living Family/patient expects to be discharged to:: Private residence Living Arrangements: Alone Available Help at Discharge: Available PRN/intermittently Type  of Home: House Home Access: Level entry     Home Layout: Two level;Able to live on main level with bedroom/bathroom Alternate Level Stairs-Number of Steps: full flgiht   Bathroom Shower/Tub: Teacher, early years/pre: Standard     Home Equipment: None          Prior Functioning/Environment Level of Independence: Independent                 OT Problem List:        OT Treatment/Interventions:      OT Goals(Current goals can be found in the care plan section) Acute Rehab OT Goals Patient Stated Goal: to go home  OT Frequency:     Barriers to D/C:            Co-evaluation              AM-PAC OT "6 Clicks" Daily Activity     Outcome Measure Help from another person eating meals?: None Help  from another person taking care of personal grooming?: None Help from another person toileting, which includes using toliet, bedpan, or urinal?: None Help from another person bathing (including washing, rinsing, drying)?: None Help from another person to put on and taking off regular upper body clothing?: None Help from another person to put on and taking off regular lower body clothing?: None 6 Click Score: 24   End of Session Equipment Utilized During Treatment: Gait belt Nurse Communication: Mobility status  Activity Tolerance: Patient tolerated treatment well Patient left: in chair;with call bell/phone within reach  OT Visit Diagnosis: Unsteadiness on feet (R26.81)                Time: 0347-4259 OT Time Calculation (min): 20 min Charges:  OT General Charges $OT Visit: 1 Visit OT Evaluation $OT Eval Moderate Complexity: 1 Mod  Darryl Nestle) Marsa Aris OTR/L Acute Rehabilitation Services Pager: [phone] Office: [place] 05/26/2019, 9:25 AM

## 2019-05-26 NOTE — TOC Transition Note (Signed)
Transition of Care [facility]) - CM/SW Discharge Note   Patient Details  Name: Teresa Robbins MRN: [id number] Date of Birth: [birth year]  Transition of Care [facility]) CM/SW Contact:  Pollie Friar, RN Phone Number: 05/26/2019, 12:09 PM   Clinical Narrative:    Pt discharging home with self care.  No f/u per PT/OT and no DME needs. Pt has PCP, insurance and transportation home.   Final next level of care: Home/Self Care Barriers to Discharge: No Barriers Identified   Patient Goals and CMS Choice        Discharge Placement                       Discharge Plan and Services                                     Social Determinants of Health (SDOH) Interventions     Readmission Risk Interventions No flowsheet data found.

## 2019-05-26 NOTE — Plan of Care (Signed)
  Problem: Clinical Measurements: Goal: Will remain free from infection Outcome: Progressing   Problem: Coping: Goal: Level of anxiety will decrease Outcome: Progressing   Problem: Elimination: Goal: Will not experience complications related to bowel motility Outcome: Progressing Goal: Will not experience complications related to urinary retention Outcome: Progressing   Problem: Pain Managment: Goal: General experience of comfort will improve Outcome: Progressing   Problem: Safety: Goal: Ability to remain free from injury will improve Outcome: Progressing   

## 2019-05-26 NOTE — Discharge Summary (Signed)
Physician Discharge Summary  YVETTA DROTAR WER:[id number] DOB: [birth year] DOA: 05/25/2019  PCP: [name], PA-C  Admit date: 05/25/2019 Discharge date: 05/26/2019  Admitted From: home Discharge disposition: home   Recommendations for Outpatient Follow-Up:   1. Consider referral to cardiology for 30 day event monitor   Discharge Diagnosis:   Active Problems:   Syncope and collapse   Ataxia    Discharge Condition: Improved.  Diet recommendation: Low sodium, heart healthy  Wound care: None.  Code status: Full.   History of Present Illness:   Teresa Robbins is a 68 y.o. female with medical history significant of hypertension, IBS, gallstones and a history of fainting.  The patient reports that this previous Friday she had an episode in the morning of sudden onset of near syncope.  She felt she was going to pass out, was able to move to her right and then against the refrigerator striking her head.  She denies any loss of consciousness.  On the day prior to admission the patient reports she had a sensation of being off balance.  She was able to ambulate.  She denies any focal weakness.  She denies any severe headache, change in vision or loss of sensation.  For these symptoms she presented to the [facility] emergency [facility] Course by Problem:   syncope with collapse -patient by her description had a near syncopal episode with no true loss of consciousness.  - Denies any palpitations or rapid heart rate.  Has no prior history of any arrhythmia. -telemetry unrevealing-- could consider event monitor as an outpatient   Ataxia -patient with disequilibrium/ataxia but no focal weakness or gait disorder.  -MRI negative -worked with PT-- no further symptoms  Smoking cessation -encourage cessation    Medical Consultants:      Discharge Exam:   Vitals:   05/26/19 0822 05/26/19 1152  BP: 104/68 113/75  Pulse: 83 87  Resp: 20 20  Temp: 98 F  (36.7 C) (!) 97.5 F (36.4 C)  SpO2: 96% 96%   Vitals:   05/26/19 0037 05/26/19 0402 05/26/19 0822 05/26/19 1152  BP: 126/74 130/66 104/68 113/75  Pulse: 82 80 83 87  Resp: 17 18 20 20   Temp: 97.6 F (36.4 C) 97.9 F (36.6 C) 98 F (36.7 C) (!) 97.5 F (36.4 C)  TempSrc: Oral Oral Oral Oral  SpO2: 96% 96% 96% 96%  Weight:      Height:        General exam: Appears calm and comfortable.    The results of significant diagnostics from this hospitalization (including imaging, microbiology, ancillary and laboratory) are listed below for reference.     Procedures and Diagnostic Studies:   Ct Head Wo Contrast  Result Date: 05/25/2019 CLINICAL DATA:  Syncope, simple, normal neuro exam, possible stroke. Additional history: Syncopal episode today, hit right-side of head. EXAM: CT HEAD WITHOUT CONTRAST TECHNIQUE: Contiguous axial images were obtained from the base of the skull through the vertex without intravenous contrast. COMPARISON:  No pertinent prior studies available for comparison. FINDINGS: Brain: There is no evidence of acute intracranial hemorrhage. 5 mm hypodensity within the anterior limb of the right internal capsule (series 3, image 14). No evidence of intracranial mass. No midline shift or extra-axial collection. Cerebral volume is age appropriate. Vascular: No hyperdense vessel. Atherosclerotic calcification of the carotid artery siphons and vertebrobasilar system. Skull: No calvarial fracture. Sinuses/Orbits: Imaged globes and orbits demonstrate no acute abnormality. Mild mucosal  thickening within the right sphenoid sinus. The imaged paranasal sinuses and mastoid air cells are otherwise well aerated. IMPRESSION: 5 mm hypodensity in the anterior limb of the right internal capsule. This may reflect an age-indeterminate lacunar infarct. Consider brain MRI for further evaluation if clinically warranted. Otherwise unremarkable CT appearance of the brain. Electronically Signed   By:  Kellie Simmering   On: 05/25/2019 14:39   Mr Brain Wo Contrast (neuro Protocol)  Result Date: 05/25/2019 CLINICAL DATA:  Near syncope. EXAM: MRI HEAD WITHOUT CONTRAST TECHNIQUE: Multiplanar, multiecho pulse sequences of the brain and surrounding structures were obtained without intravenous contrast. COMPARISON:  Head CT 05/25/2019 FINDINGS: Brain: There is no evidence of acute infarct, intracranial hemorrhage, mass, midline shift, or extra-axial fluid collection. The ventricles and sulci are normal. Scattered small foci of T2 hyperintensity in the juxtacortical and deep cerebral white matter bilaterally and in the pons are mildly advanced for age. Vascular: Major intracranial vascular flow voids are preserved. Skull and upper cervical spine: Unremarkable bone marrow signal. Sinuses/Orbits: Unremarkable orbits. Right sphenoid sinus mucosal thickening and/or fluid. Clear mastoid air cells. Other: None. IMPRESSION: 1. No acute intracranial abnormality. 2. Mildly age advanced cerebral white matter T2 signal changes, nonspecific though most often seen with chronic small vessel ischemia. Electronically Signed   By: Logan Bores M.D.   On: 05/25/2019 18:34     Labs:   Basic Metabolic Panel: Recent Labs  Lab 05/25/19 1330 05/25/19 1338 05/25/19 1721 05/26/19 0328  NA 132* 130*  --  132*  K 4.6 4.2  --  3.5  CL 96* 97*  --  101  CO2 24  --   --  20*  GLUCOSE 103* 99  --  90  BUN 8 8  --  7*  CREATININE 0.70 0.60 0.61 0.66  CALCIUM 9.2  --   --  8.4*   GFR Estimated Creatinine Clearance: 66.6 mL/min (by C-G formula based on SCr of 0.66 mg/dL). Liver Function Tests: Recent Labs  Lab 05/25/19 1330  AST 21  ALT 18  ALKPHOS 62  BILITOT 0.7  PROT 7.2  ALBUMIN 4.1   No results for input(s): LIPASE, AMYLASE in the last 168 hours. No results for input(s): AMMONIA in the last 168 hours. Coagulation profile Recent Labs  Lab 05/25/19 1330  INR 1.0    CBC: Recent Labs  Lab 05/25/19 1330  05/25/19 1338 05/25/19 1721  WBC 6.9  --  6.4  NEUTROABS 3.9  --   --   HGB 14.3 15.0 13.5  HCT 42.7 44.0 39.6  MCV 96.6  --  95.2  PLT 322  --  294   Cardiac Enzymes: No results for input(s): CKTOTAL, CKMB, CKMBINDEX, TROPONINI in the last 168 hours. BNP: Invalid input(s): POCBNP CBG: Recent Labs  Lab 05/25/19 1632  GLUCAP 85   D-Dimer No results for input(s): DDIMER in the last 72 hours. Hgb A1c No results for input(s): HGBA1C in the last 72 hours. Lipid Profile No results for input(s): CHOL, HDL, LDLCALC, TRIG, CHOLHDL, LDLDIRECT in the last 72 hours. Thyroid function studies No results for input(s): TSH, T4TOTAL, T3FREE, THYROIDAB in the last 72 hours.  Invalid input(s): FREET3 Anemia work up No results for input(s): VITAMINB12, FOLATE, FERRITIN, TIBC, IRON, RETICCTPCT in the last 72 hours. Microbiology Recent Results (from the past 240 hour(s))  SARS CORONAVIRUS 2 Nasal Swab Aptima Multi Swab     Status: None   Collection Time: 05/25/19  3:00 PM   Specimen: Aptima Multi  Swab; Nasal Swab  Result Value Ref Range Status   SARS Coronavirus 2 NEGATIVE NEGATIVE Final    Comment: (NOTE) SARS-CoV-2 target nucleic acids are NOT DETECTED. The SARS-CoV-2 RNA is generally detectable in upper and lower respiratory specimens during the acute phase of infection. Negative results do not preclude SARS-CoV-2 infection, do not rule out co-infections with other pathogens, and should not be used as the sole basis for treatment or other patient management decisions. Negative results must be combined with clinical observations, patient history, and epidemiological information. The expected result is Negative. Fact Sheet for Patients: SugarRoll.be Fact Sheet for Healthcare Providers: https://www.woods-mathews.com/ This test is not yet approved or cleared by the [place] FDA and  has been authorized for detection and/or diagnosis of  SARS-CoV-2 by FDA under an Emergency Use Authorization (EUA). This EUA will remain  in effect (meaning this test can be used) for the duration of the COVID-19 declaration under Section 56 4(b)(1) of the Act, 21 U.S.C. section 360bbb-3(b)(1), unless the authorization is terminated or revoked sooner. Performed at [facility], [place] [address] [place], [place] [address]      Discharge Instructions:   Discharge Instructions    Diet - low sodium heart healthy   Complete by: As directed    Increase activity slowly   Complete by: As directed      Allergies as of 05/26/2019      Reactions   Morphine Itching   Sulfamethoxazole-trimethoprim Other (See Comments)   Weak/tired, "felt like I was walking around in a fog"      Medication List    STOP taking these medications   hydrochlorothiazide 25 MG tablet Commonly known as: HYDRODIURIL   linaclotide 72 MCG capsule Commonly known as: Linzess     TAKE these medications   amLODipine 10 MG tablet Commonly known as: NORVASC Take 10 mg by mouth daily.   aspirin 81 MG tablet Take 81 mg by mouth every other day.   CALCIUM+D3 PO Take 1 tablet by mouth daily with breakfast.   ibandronate 150 MG tablet Commonly known as: BONIVA Take 150 mg by mouth every 30 (thirty) days. Take in the morning with a full glass of water, on an empty stomach, and do not take anything else by mouth or lie down for the next 30 min.   losartan 100 MG tablet Commonly known as: COZAAR Take 100 mg by mouth daily.   MULTIVITAMIN PO Take 1 tablet by mouth daily with breakfast.   PARoxetine 10 MG tablet Commonly known as: PAXIL Take 10 mg by mouth every morning.   V-R NASAL SPRAY SALINE 0.65 % nasal spray Generic drug: sodium chloride Place 1-2 sprays into both nostrils as needed for congestion.      Follow-up Information    Redmon, Noelle, PA-C Follow up in 1 week(s).   Specialty: Nurse Practitioner Contact information: [address] [place] [address] [phone]            Time coordinating discharge: 25 min  Signed:  Geradine Girt DO  [facility] 05/26/2019, 12:06 PM

## 2019-06-03 ENCOUNTER — Other Ambulatory Visit: Payer: Self-pay | Admitting: Radiology

## 2019-06-03 DIAGNOSIS — R55 Syncope and collapse: Secondary | ICD-10-CM

## 2019-06-08 ENCOUNTER — Ambulatory Visit (INDEPENDENT_AMBULATORY_CARE_PROVIDER_SITE_OTHER): Payer: Medicare Other

## 2019-06-08 DIAGNOSIS — R55 Syncope and collapse: Secondary | ICD-10-CM

## 2019-08-06 ENCOUNTER — Telehealth: Payer: Self-pay | Admitting: General Practice

## 2019-08-06 NOTE — Telephone Encounter (Signed)
  Dr Barrie Folk needs some clarification on results of holter monitor

## 2019-08-06 NOTE — Telephone Encounter (Signed)
I spoke with Heather. Requesting clarification of monitor results--specifically #2 regarding pause/heart block Will forward to Dr Lovena Le for review.

## 2019-08-06 NOTE — Telephone Encounter (Signed)
I returned call and office will have [place] call back.

## 2019-08-07 NOTE — Telephone Encounter (Signed)
There is a nocturnal episode that most likely represents signal dropout. I cannot exclude transient complete heart block. Regardless, we do not treat nocturnal heart block. Unless the patient is not sleeping between midnight and 0600.  GT

## 2019-08-09 NOTE — Telephone Encounter (Signed)
I spoke with Teresa Robbins and gave her information from Dr Lovena Le

## 2019-08-25 ENCOUNTER — Other Ambulatory Visit: Payer: Self-pay

## 2019-08-25 ENCOUNTER — Encounter (HOSPITAL_COMMUNITY): Payer: Self-pay

## 2019-08-25 ENCOUNTER — Ambulatory Visit (HOSPITAL_COMMUNITY)
Admission: EM | Admit: 2019-08-25 | Discharge: 2019-08-25 | Disposition: A | Discharge status: Home / Self Care | Payer: Medicare Other | Attending: Family Medicine | Admitting: Family Medicine

## 2019-08-25 DIAGNOSIS — Z806 Family history of leukemia: Secondary | ICD-10-CM | POA: Insufficient documentation

## 2019-08-25 DIAGNOSIS — F17210 Nicotine dependence, cigarettes, uncomplicated: Secondary | ICD-10-CM | POA: Insufficient documentation

## 2019-08-25 DIAGNOSIS — Z20828 Contact with and (suspected) exposure to other viral communicable diseases: Secondary | ICD-10-CM | POA: Diagnosis not present

## 2019-08-25 DIAGNOSIS — Z9013 Acquired absence of bilateral breasts and nipples: Secondary | ICD-10-CM | POA: Insufficient documentation

## 2019-08-25 DIAGNOSIS — Z803 Family history of malignant neoplasm of breast: Secondary | ICD-10-CM | POA: Diagnosis not present

## 2019-08-25 DIAGNOSIS — I10 Essential (primary) hypertension: Secondary | ICD-10-CM | POA: Diagnosis not present

## 2019-08-25 DIAGNOSIS — Z8042 Family history of malignant neoplasm of prostate: Secondary | ICD-10-CM | POA: Insufficient documentation

## 2019-08-25 DIAGNOSIS — Z818 Family history of other mental and behavioral disorders: Secondary | ICD-10-CM | POA: Insufficient documentation

## 2019-08-25 DIAGNOSIS — H3500 Unspecified background retinopathy: Secondary | ICD-10-CM | POA: Insufficient documentation

## 2019-08-25 DIAGNOSIS — Z9049 Acquired absence of other specified parts of digestive tract: Secondary | ICD-10-CM | POA: Insufficient documentation

## 2019-08-25 DIAGNOSIS — Z8249 Family history of ischemic heart disease and other diseases of the circulatory system: Secondary | ICD-10-CM | POA: Insufficient documentation

## 2019-08-25 DIAGNOSIS — E669 Obesity, unspecified: Secondary | ICD-10-CM | POA: Diagnosis not present

## 2019-08-25 DIAGNOSIS — F329 Major depressive disorder, single episode, unspecified: Secondary | ICD-10-CM | POA: Insufficient documentation

## 2019-08-25 DIAGNOSIS — Z885 Allergy status to narcotic agent status: Secondary | ICD-10-CM | POA: Insufficient documentation

## 2019-08-25 DIAGNOSIS — J018 Other acute sinusitis: Secondary | ICD-10-CM | POA: Diagnosis present

## 2019-08-25 DIAGNOSIS — Z823 Family history of stroke: Secondary | ICD-10-CM | POA: Insufficient documentation

## 2019-08-25 DIAGNOSIS — Z7982 Long term (current) use of aspirin: Secondary | ICD-10-CM | POA: Insufficient documentation

## 2019-08-25 DIAGNOSIS — Z881 Allergy status to other antibiotic agents status: Secondary | ICD-10-CM | POA: Diagnosis not present

## 2019-08-25 DIAGNOSIS — Z79899 Other long term (current) drug therapy: Secondary | ICD-10-CM | POA: Insufficient documentation

## 2019-08-25 DIAGNOSIS — Z6829 Body mass index (BMI) 29.0-29.9, adult: Secondary | ICD-10-CM | POA: Insufficient documentation

## 2019-08-25 DIAGNOSIS — R202 Paresthesia of skin: Secondary | ICD-10-CM | POA: Insufficient documentation

## 2019-08-25 LAB — COMPREHENSIVE METABOLIC PANEL
ALT: 17 U/L (ref 0–44)
AST: 18 U/L (ref 15–41)
Albumin: 3.9 g/dL (ref 3.5–5.0)
Alkaline Phosphatase: 72 U/L (ref 38–126)
Anion gap: 10 (ref 5–15)
BUN: 9 mg/dL (ref 8–23)
CO2: 26 mmol/L (ref 22–32)
Calcium: 9 mg/dL (ref 8.9–10.3)
Chloride: 97 mmol/L — ABNORMAL LOW (ref 98–111)
Creatinine, Ser: 0.63 mg/dL (ref 0.44–1.00)
GFR calc Af Amer: >60 mL/min (ref >60)
GFR calc non Af Amer: >60 mL/min (ref >60)
Glucose, Bld: 100 mg/dL — ABNORMAL HIGH (ref 70–99)
Potassium: 4.3 mmol/L (ref 3.5–5.1)
Sodium: 133 mmol/L — ABNORMAL LOW (ref 135–145)
Total Bilirubin: 0.5 mg/dL (ref 0.3–1.2)
Total Protein: 7 g/dL (ref 6.5–8.1)

## 2019-08-25 LAB — CBC WITH DIFFERENTIAL/PLATELET
Abs Immature Granulocytes: 0.1 10*3/uL — ABNORMAL HIGH (ref 0.00–0.07)
Basophils Absolute: 0.1 10*3/uL (ref 0.0–0.1)
Basophils Relative: 1 %
Eosinophils Absolute: 0.1 10*3/uL (ref 0.0–0.5)
Eosinophils Relative: 1 %
HCT: 42 % (ref 36.0–46.0)
Hemoglobin: 14 g/dL (ref 12.0–15.0)
Immature Granulocytes: 1 %
Lymphocytes Relative: 29 %
Lymphs Abs: 3.1 10*3/uL (ref 0.7–4.0)
MCH: 32 pg (ref 26.0–34.0)
MCHC: 33.3 g/dL (ref 30.0–36.0)
MCV: 95.9 fL (ref 80.0–100.0)
Monocytes Absolute: 0.9 10*3/uL (ref 0.1–1.0)
Monocytes Relative: 8 %
Neutro Abs: 6.4 10*3/uL (ref 1.7–7.7)
Neutrophils Relative %: 60 %
Platelets: 368 10*3/uL (ref 150–400)
RBC: 4.38 MIL/uL (ref 3.87–5.11)
RDW: 12.2 % (ref 11.5–15.5)
WBC: 10.7 10*3/uL — ABNORMAL HIGH (ref 4.0–10.5)
nRBC: 0 % (ref 0.0–0.2)

## 2019-08-25 LAB — TSH: TSH: 0.754 u[IU]/mL (ref 0.350–4.500)

## 2019-08-25 NOTE — ED Triage Notes (Signed)
Pt states she has had this numbness and tingle around her mouth and nose. Pt states she has had some sinus pressure and drianage. Pt states she has not had a apetite.

## 2019-08-25 NOTE — ED Provider Notes (Signed)
[facility]    CSN: [id number] Arrival date & time: 08/25/19  1140      History   Chief Complaint Chief Complaint  Patient presents with  . Numbness    HPI Teresa Robbins is a 68 y.o. female.   Teresa Robbins presents with complaints of a numbness or vague sensation abnormality to nose and around to upper mouth. This has been constant for the past three days or so. She sometimes feels a tingling sensation to her fingers, her pinkies, bilaterally, in particular. She developed sinus drainage and congestion last week, she did have frontal pressure at that time which has resolved. No fevers. States her top teeth even feel different, notices when she eats and brushes her teeth. No facial weakness. No rash. No pain. No extremity cramping. Decreased appetite and abnormal taste. Normal smelling. 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 No new cough. She does smoke. No headache, no vision changes, no dizziness. No new neck pain. No dental pain. No history  Of thyroid issues or surgery. History  Of chest pain , depression, cholecystectomy, breast surgery, htn.    ROS per HPI, negative if not otherwise mentioned.      Past Medical History:  Diagnosis Date  . Abdominal pain   . Chest pain   . Constipation   . Depression   . Diverticulosis   . Fainting   . Gallstones   . HLD (hyperlipidemia)   . Hx of adenomatous colonic polyps 2001  . Hypercholesterolemia   . Hyperplastic colon polyp 2012  . Hypertension   . IBS (irritable bowel syndrome)   . Obesity   . Retinopathy   . Weakness     Patient Active Problem List   Diagnosis Date Noted  . Syncope and collapse 05/25/2019  . Ataxia 05/25/2019  . Hemorrhoids 02/20/2017  . Constipation 02/20/2017  . Gallstones 10/16/2012    Past Surgical History:  Procedure Laterality Date  . BREAST LUMPECTOMY Right 1997 - approximate  . BUNIONECTOMY Bilateral 1995 - approximate  . [place]   w/tubal ligation  . CHOLECYSTECTOMY    . MASTECTOMY Bilateral     OB History   No obstetric history on file.      Home Medications    Prior to Admission medications   Medication Sig Start Date End Date Taking? Authorizing Provider  amLODipine (NORVASC) 10 MG tablet Take 10 mg by mouth daily.    [provider]  aspirin 81 MG tablet Take 81 mg by mouth every other day.     [provider]  Calcium Carb-Cholecalciferol (CALCIUM+D3 PO) Take 1 tablet by mouth daily with breakfast.    [provider]  ibandronate (BONIVA) 150 MG tablet Take 150 mg by mouth every 30 (thirty) days. Take in the morning with a full glass of water, on an empty stomach, and do not take anything else by mouth or lie down for the next 30 min.    [provider]  losartan (COZAAR) 100 MG tablet Take 100 mg by mouth daily.    [provider]  Multiple Vitamins-Minerals (MULTIVITAMIN PO) Take 1 tablet by mouth daily with breakfast.     [provider]  PARoxetine (PAXIL) 10 MG tablet Take 10 mg by mouth every morning.    [provider]  sodium chloride (V-R NASAL SPRAY SALINE) 0.65 % nasal spray Place 1-2 sprays into both nostrils as needed for congestion.  [provider]    Family History Family History  Problem Relation Age of Onset  . Colon polyps Mother   . Depression Mother   . COPD Mother   . Colon polyps Father   . Prostate cancer Father   . Hypertension Father   . Breast cancer Daughter   . Leukemia Daughter   . Hypertension Daughter   . Breast cancer Paternal Aunt   . Breast cancer Cousin        breast - on mom's side  . Depression Brother   . Hypertension Brother   . Stroke Paternal Grandmother   . Hypertension Sister   . Hypertension Brother     Social History Social History   Tobacco Use  . Smoking status: Current Every Day Smoker    Packs/day: 0.25  . Smokeless tobacco: Never Used  Substance Use  Topics  . Alcohol use: No  . Drug use: No     Allergies   Morphine and Sulfamethoxazole-trimethoprim   Review of Systems Review of Systems   Physical Exam Triage Vital Signs ED Triage Vitals  Enc Vitals Group     BP 08/25/19 1224 137/83     Pulse Rate 08/25/19 1224 91     Resp 08/25/19 1224 18     Temp 08/25/19 1224 98 F (36.7 C)     Temp Source 08/25/19 1224 Temporal     SpO2 08/25/19 1224 97 %     Weight 08/25/19 1227 170 lb (77.1 kg)     Height --      Head Circumference --      Peak Flow --      Pain Score 08/25/19 1226 0     Pain Loc --      Pain Edu? --      Excl. in [place]? --    No data found.  Updated Vital Signs BP 137/83 (BP Location: Left Arm)   Pulse 91   Temp 98 F (36.7 C) (Temporal)   Resp 18   Wt 170 lb (77.1 kg)   SpO2 97%   BMI 29.18 kg/m    Physical Exam Constitutional:      General: She is not in acute distress.    Appearance: She is well-developed.  HENT:     Head: Normocephalic.     Mouth/Throat:     Mouth: Mucous membranes are moist.  Eyes:     Extraocular Movements: Extraocular movements intact.     Pupils: Pupils are equal, round, and reactive to light.  Cardiovascular:     Rate and Rhythm: Normal rate.  Pulmonary:     Effort: Pulmonary effort is normal.  Skin:    General: Skin is warm and dry.  Neurological:     General: No focal deficit present.     Mental Status: She is alert and oriented to person, place, and time.     Cranial Nerves: No cranial nerve deficit.     Sensory: No sensory deficit.     Motor: No weakness.     Coordination: Coordination normal.     Gait: Gait normal.     Deep Tendon Reflexes: Reflexes normal.     Comments: Subjective sensation of change of sensation to nose and upper lip but gross sensation intact; no facial pressure; face is symmetrical and without pain; clear speech; normal nose to finger       UC Treatments / Results  Labs (all labs ordered are listed, but only abnormal results  are displayed) Labs  Reviewed  NOVEL CORONAVIRUS, NAA (HOSP ORDER, SEND-OUT TO REF LAB; TAT 18-24 HRS)  COMPREHENSIVE METABOLIC PANEL  CBC WITH DIFFERENTIAL/PLATELET  TSH    EKG   Radiology No results found.  Procedures Procedures (including critical care time)  Medications Ordered in UC Medications - No data to display  Initial Impression / Assessment and Plan / UC Course  I have reviewed the triage vital signs and the nursing notes.  Pertinent labs & imaging results that were available during my care of the patient were reviewed by me and considered in my medical decision making (see chart for details).     No indications on exam or with history of CVA. Sinusitis vs covid vs URI vs electrolyte abnormality (K, Ca for example) discussed and considered. covid testing pending. Basic labs pending. No red flag findings on exam. Sinus symptoms are improving, no medications initiated at this time. Return precautions provided. If symptoms worsen or do not improve in the next week to return to be seen or to follow up with PCP.  Patient verbalized understanding and agreeable to plan.    Final Clinical Impressions(s) / UC Diagnoses   Final diagnoses:  Paresthesia     Discharge Instructions     I will check some basic labs to make sure there is no abnormality which could potentially be contributing.  We will also test for Covid-19 as it has caused some sensory issues for people.  Will notify you of any positive findings and if any changes to treatment are needed.   Self isolate until covid results are back and negative.  Will notify you by phone of any positive findings. Your negative results will be sent through your MyChart.     If symptoms persist please follow up with your primary care provider.  If symptoms worsen please go to the ER- numbness, tingling, weakness, dizziness, headache, vision changes, fevers, or otherwise worsening.     ED Prescriptions    None     PDMP  not reviewed this encounter.   Zigmund Gottron, NP 08/25/19 1401

## 2019-08-25 NOTE — Discharge Instructions (Signed)
I will check some basic labs to make sure there is no abnormality which could potentially be contributing.  We will also test for Covid-19 as it has caused some sensory issues for people.  Will notify you of any positive findings and if any changes to treatment are needed.   Self isolate until covid results are back and negative.  Will notify you by phone of any positive findings. Your negative results will be sent through your MyChart.     If symptoms persist please follow up with your primary care provider.  If symptoms worsen please go to the ER- numbness, tingling, weakness, dizziness, headache, vision changes, fevers, or otherwise worsening.

## 2019-08-27 ENCOUNTER — Telehealth: Payer: Self-pay | Admitting: Emergency Medicine

## 2019-08-27 LAB — NOVEL CORONAVIRUS, NAA (HOSP ORDER, SEND-OUT TO REF LAB; TAT 18-24 HRS): SARS-CoV-2, NAA: NOT DETECTED

## 2019-08-27 NOTE — Telephone Encounter (Signed)
Attempted to reach patient to discuss blood work and provider instruction. No answer at this time.

## 2019-09-03 ENCOUNTER — Ambulatory Visit
Admission: RE | Admit: 2019-09-03 | Discharge: 2019-09-03 | Disposition: A | Discharge status: Home / Self Care | Payer: Medicare Other | Source: Ambulatory Visit | Attending: Physician Assistant | Admitting: Physician Assistant

## 2019-09-03 ENCOUNTER — Other Ambulatory Visit: Payer: Self-pay | Admitting: Physician Assistant

## 2019-09-03 DIAGNOSIS — R059 Cough, unspecified: Secondary | ICD-10-CM

## 2019-09-03 DIAGNOSIS — R05 Cough: Secondary | ICD-10-CM

## 2020-07-29 IMAGING — MR MRI HEAD WITHOUT CONTRAST
9 of 11 series · 33 of 48 positions shown · non-contrast
Comparison: Head CT 05/25/2019

CLINICAL DATA: Near syncope.

EXAM:
MRI HEAD WITHOUT CONTRAST
TECHNIQUE: Multiplanar, multiecho pulse sequences of the brain and surrounding
structures were obtained without intravenous contrast.

[Series 4: DWI · axial · 3.0mm · 0.94mm/px · z∈[-71,+70]mm · 7 of 95 slices shown (1 of 2)]
[im 1/95]
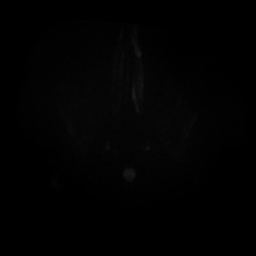
[im 16/95]
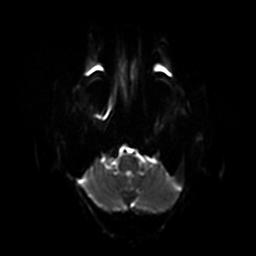
[im 32/95]
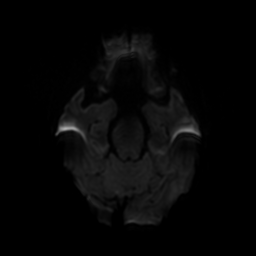
[im 48/95]
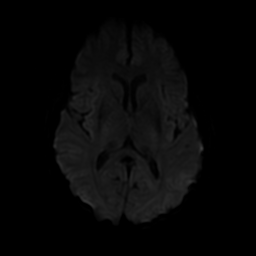
[im 63/95]
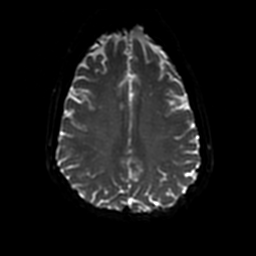
[im 79/95]
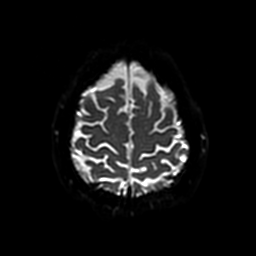
[im 95/95]
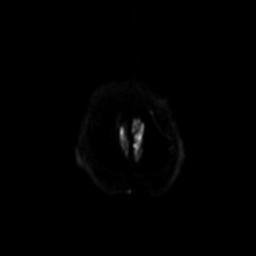

[Series 5: DWI · coronal · 4.0mm · 0.94mm/px · 6 of 72 slices shown (2 of 2)]
[im 1/72]
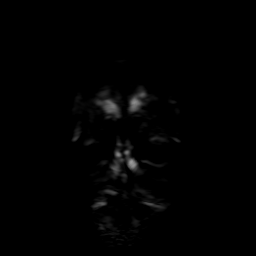
[im 15/72]
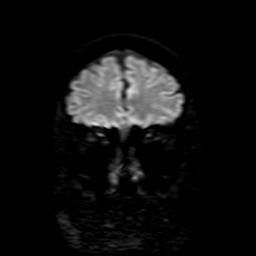
[im 29/72]
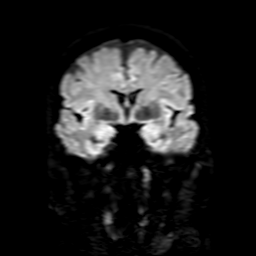
[im 43/72]
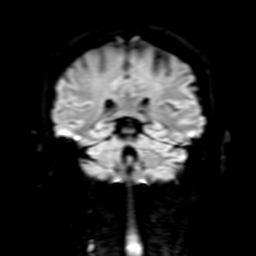
[im 57/72]
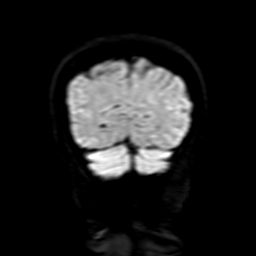
[im 72/72]
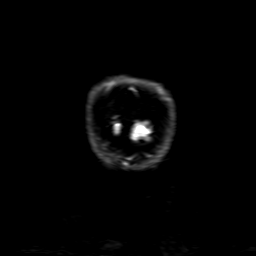

[Series 6: FLAIR · sagittal · 5.0mm · 0.47mm/px · 2 of 23 slices shown (1 of 2)]
[im 1/23]
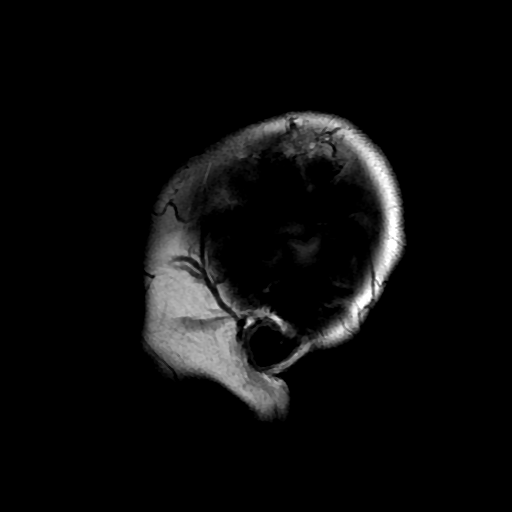
[im 23/23]
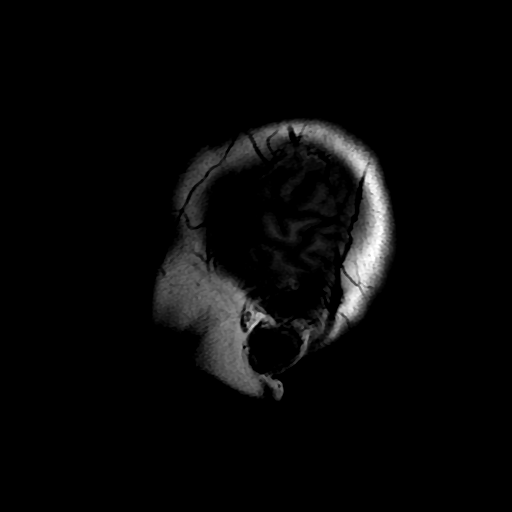

[Series 7: T2 · axial · 5.0mm · 0.47mm/px · z∈[-70,+68]mm · 2 of 24 slices shown (1 of 2)]
[im 1/24]
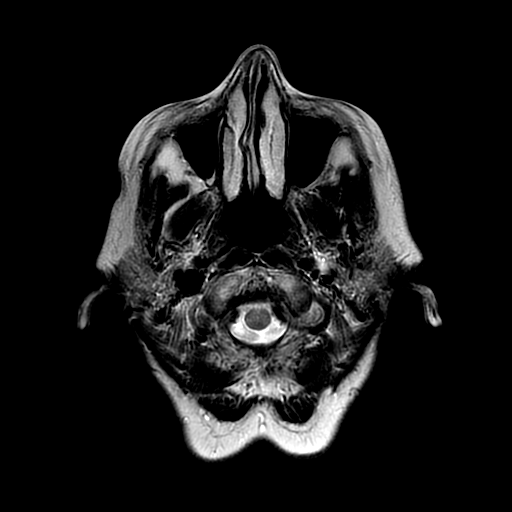
[im 24/24]
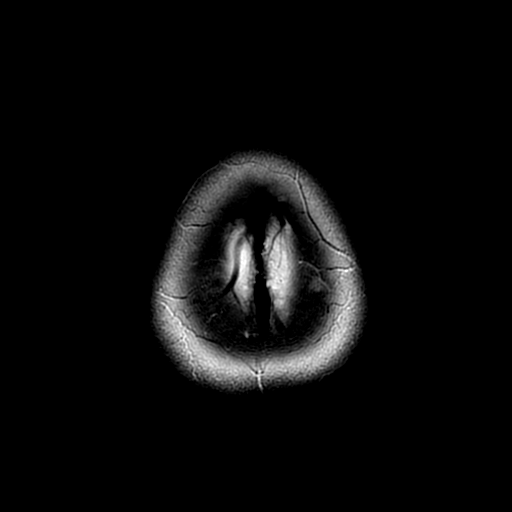

[Series 8: FLAIR · axial · 5.0mm · 0.47mm/px · z∈[-70,+68]mm · 2 of 24 slices shown (2 of 2)]
[im 1/24]
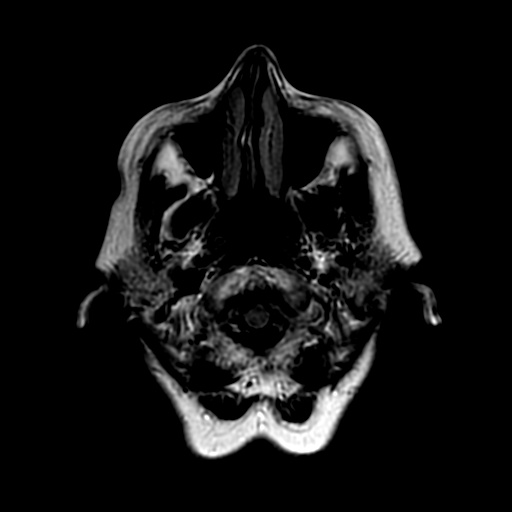
[im 24/24]
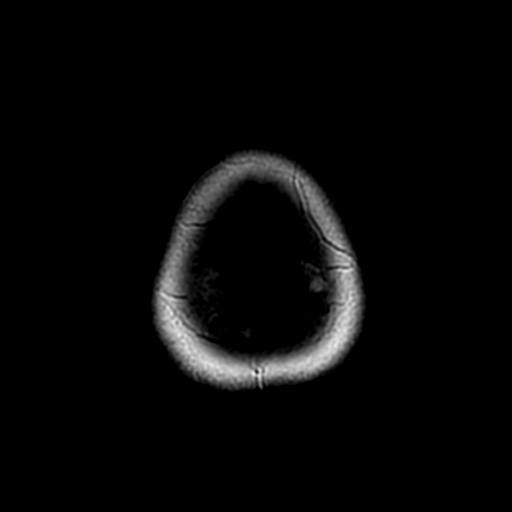

[Series 9: SWI · axial · 3.0mm · 0.47mm/px · z∈[-71,+10]mm · 5 of 96 slices shown]
[im 1/96]
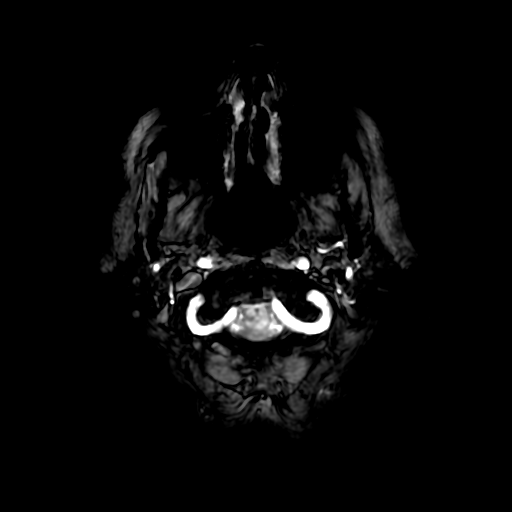
[im 14/96]
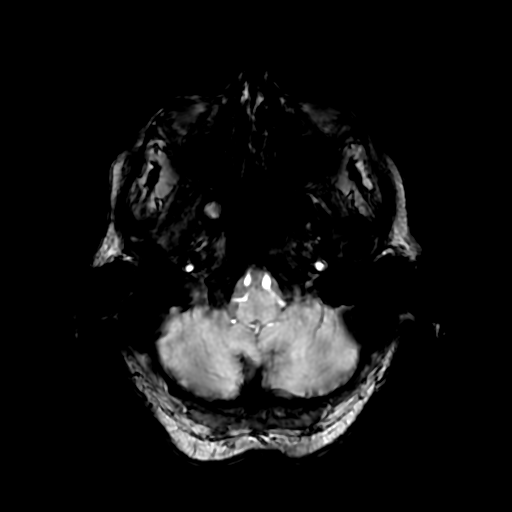
[im 28/96]
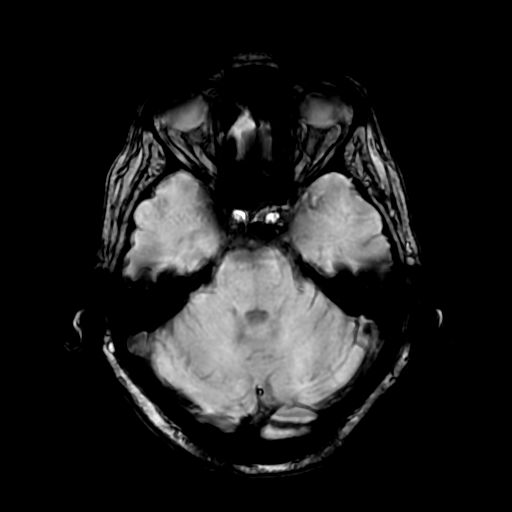
[im 41/96]
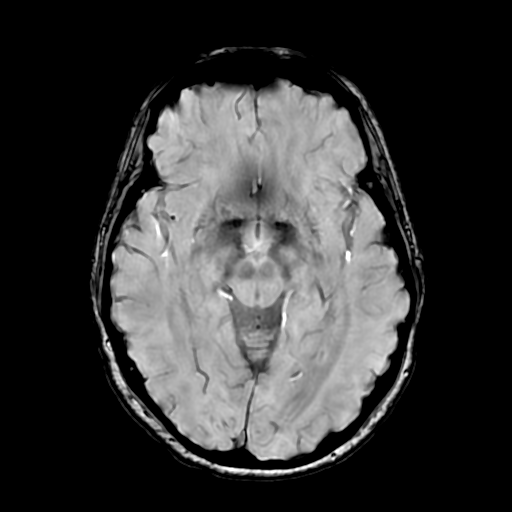
[im 55/96]
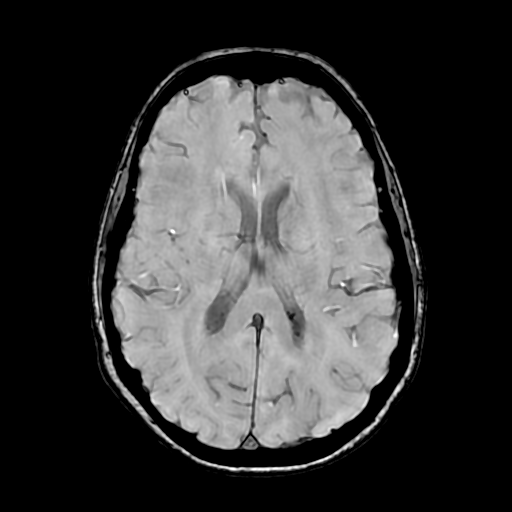

[Series 11: T2 · coronal · 5.0mm · 0.47mm/px · 2 of 30 slices shown (2 of 2)]
[im 1/30]
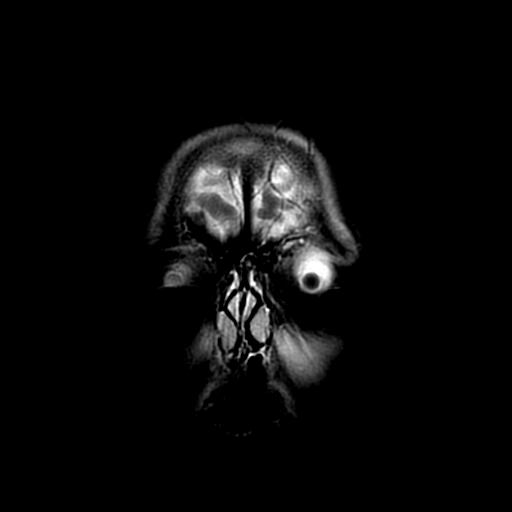
[im 30/30]
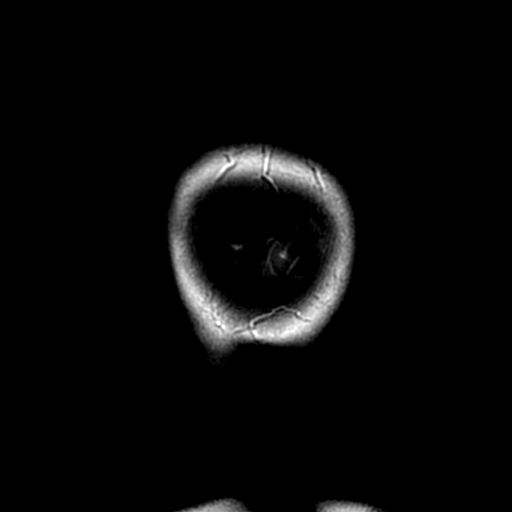

[Series 450: ADC · axial · 3.0mm · 0.94mm/px · z∈[-71,+70]mm · 4 of 48 slices shown (1 of 2)]
[im 1/48]
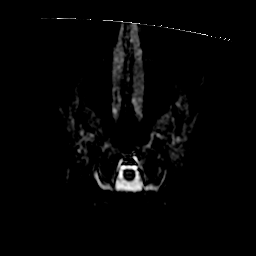
[im 16/48]
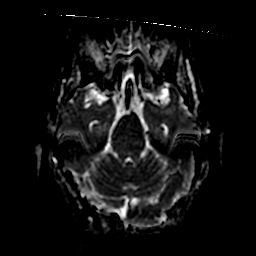
[im 32/48]
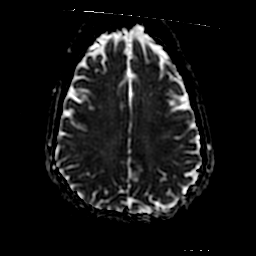
[im 48/48]
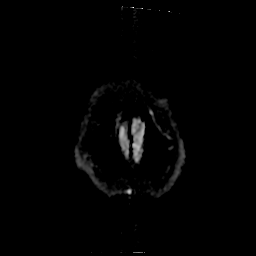

[Series 550: ADC · coronal · 4.0mm · 0.94mm/px · 3 of 36 slices shown (2 of 2)]
[im 1/36]
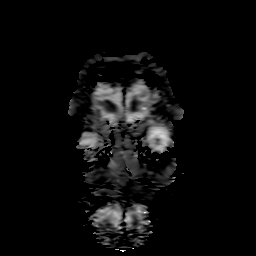
[im 18/36]
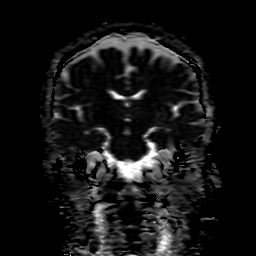
[im 36/36]
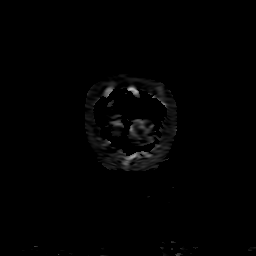

[33 of 48 positions shown; findings below may reference images not displayed]

FINDINGS: Brain: There is no evidence of acute infarct, intracranial
hemorrhage, mass, midline shift, or extra-axial fluid collection.
The ventricles and sulci are normal. Scattered small foci of T2
hyperintensity in the juxtacortical and deep cerebral white matter
bilaterally and in the pons are mildly advanced for age.

Vascular: Major intracranial vascular flow voids are preserved.

Skull and upper cervical spine: Unremarkable bone marrow signal.

Sinuses/Orbits: Unremarkable orbits. Right sphenoid sinus mucosal
thickening and/or fluid. Clear mastoid air cells.

Other: None.
IMPRESSION: 1. No acute intracranial abnormality.
2. Mildly age advanced cerebral white matter T2 signal changes,
nonspecific though most often seen with chronic small vessel
ischemia.

## 2020-09-29 ENCOUNTER — Other Ambulatory Visit: Payer: Self-pay | Admitting: Physician Assistant

## 2020-09-29 DIAGNOSIS — M810 Age-related osteoporosis without current pathological fracture: Secondary | ICD-10-CM

## 2020-11-10 DIAGNOSIS — S63501A Unspecified sprain of right wrist, initial encounter: Secondary | ICD-10-CM | POA: Diagnosis not present

## 2020-11-10 DIAGNOSIS — M25531 Pain in right wrist: Secondary | ICD-10-CM | POA: Diagnosis not present

## 2021-01-09 DIAGNOSIS — H2513 Age-related nuclear cataract, bilateral: Secondary | ICD-10-CM | POA: Diagnosis not present

## 2021-01-09 DIAGNOSIS — H52203 Unspecified astigmatism, bilateral: Secondary | ICD-10-CM | POA: Diagnosis not present

## 2021-01-10 ENCOUNTER — Ambulatory Visit
Admission: RE | Admit: 2021-01-10 | Discharge: 2021-01-10 | Disposition: A | Discharge status: Home / Self Care | Payer: Medicare Other | Source: Ambulatory Visit | Attending: Physician Assistant | Admitting: Physician Assistant

## 2021-01-10 ENCOUNTER — Other Ambulatory Visit: Payer: Self-pay

## 2021-01-10 DIAGNOSIS — M8589 Other specified disorders of bone density and structure, multiple sites: Secondary | ICD-10-CM | POA: Diagnosis not present

## 2021-01-10 DIAGNOSIS — Z780 Asymptomatic menopausal state: Secondary | ICD-10-CM | POA: Diagnosis not present

## 2021-01-10 DIAGNOSIS — M810 Age-related osteoporosis without current pathological fracture: Secondary | ICD-10-CM

## 2021-03-09 DIAGNOSIS — L603 Nail dystrophy: Secondary | ICD-10-CM | POA: Diagnosis not present

## 2021-03-09 DIAGNOSIS — L608 Other nail disorders: Secondary | ICD-10-CM | POA: Diagnosis not present

## 2021-03-09 DIAGNOSIS — D485 Neoplasm of uncertain behavior of skin: Secondary | ICD-10-CM | POA: Diagnosis not present

## 2021-03-09 DIAGNOSIS — L820 Inflamed seborrheic keratosis: Secondary | ICD-10-CM | POA: Diagnosis not present

## 2021-03-09 DIAGNOSIS — D3617 Benign neoplasm of peripheral nerves and autonomic nervous system of trunk, unspecified: Secondary | ICD-10-CM | POA: Diagnosis not present

## 2021-03-13 ENCOUNTER — Ambulatory Visit
Admission: RE | Admit: 2021-03-13 | Discharge: 2021-03-13 | Disposition: A | Discharge status: Home / Self Care | Payer: Medicare Other | Source: Ambulatory Visit | Attending: Dermatology | Admitting: Dermatology

## 2021-03-13 ENCOUNTER — Other Ambulatory Visit: Payer: Self-pay | Admitting: Dermatology

## 2021-03-13 DIAGNOSIS — L608 Other nail disorders: Secondary | ICD-10-CM

## 2021-03-13 DIAGNOSIS — M19072 Primary osteoarthritis, left ankle and foot: Secondary | ICD-10-CM | POA: Diagnosis not present

## 2021-03-21 DIAGNOSIS — M79675 Pain in left toe(s): Secondary | ICD-10-CM | POA: Diagnosis not present

## 2021-03-21 DIAGNOSIS — I739 Peripheral vascular disease, unspecified: Secondary | ICD-10-CM | POA: Diagnosis not present

## 2021-03-21 DIAGNOSIS — I70219 Atherosclerosis of native arteries of extremities with intermittent claudication, unspecified extremity: Secondary | ICD-10-CM | POA: Diagnosis not present

## 2021-03-21 DIAGNOSIS — L03032 Cellulitis of left toe: Secondary | ICD-10-CM | POA: Diagnosis not present

## 2021-03-21 DIAGNOSIS — M792 Neuralgia and neuritis, unspecified: Secondary | ICD-10-CM | POA: Diagnosis not present

## 2021-03-29 DIAGNOSIS — I739 Peripheral vascular disease, unspecified: Secondary | ICD-10-CM | POA: Diagnosis not present

## 2021-06-08 DIAGNOSIS — H35039 Hypertensive retinopathy, unspecified eye: Secondary | ICD-10-CM | POA: Diagnosis not present

## 2021-06-08 DIAGNOSIS — I119 Hypertensive heart disease without heart failure: Secondary | ICD-10-CM | POA: Diagnosis not present

## 2021-06-08 DIAGNOSIS — R609 Edema, unspecified: Secondary | ICD-10-CM | POA: Diagnosis not present

## 2021-06-08 DIAGNOSIS — I700 Atherosclerosis of aorta: Secondary | ICD-10-CM | POA: Diagnosis not present

## 2021-06-08 DIAGNOSIS — E7800 Pure hypercholesterolemia, unspecified: Secondary | ICD-10-CM | POA: Diagnosis not present

## 2021-06-08 DIAGNOSIS — M810 Age-related osteoporosis without current pathological fracture: Secondary | ICD-10-CM | POA: Diagnosis not present

## 2021-09-13 DIAGNOSIS — Z853 Personal history of malignant neoplasm of breast: Secondary | ICD-10-CM | POA: Diagnosis not present

## 2021-09-13 DIAGNOSIS — Z0000 Encounter for general adult medical examination without abnormal findings: Secondary | ICD-10-CM | POA: Diagnosis not present

## 2021-09-13 DIAGNOSIS — Z1211 Encounter for screening for malignant neoplasm of colon: Secondary | ICD-10-CM | POA: Diagnosis not present

## 2021-09-13 DIAGNOSIS — Z9013 Acquired absence of bilateral breasts and nipples: Secondary | ICD-10-CM | POA: Diagnosis not present

## 2021-09-13 DIAGNOSIS — Z23 Encounter for immunization: Secondary | ICD-10-CM | POA: Diagnosis not present

## 2021-09-13 DIAGNOSIS — E7800 Pure hypercholesterolemia, unspecified: Secondary | ICD-10-CM | POA: Diagnosis not present

## 2021-09-13 DIAGNOSIS — Z122 Encounter for screening for malignant neoplasm of respiratory organs: Secondary | ICD-10-CM | POA: Diagnosis not present

## 2021-09-13 DIAGNOSIS — H35039 Hypertensive retinopathy, unspecified eye: Secondary | ICD-10-CM | POA: Diagnosis not present

## 2021-09-13 DIAGNOSIS — M810 Age-related osteoporosis without current pathological fracture: Secondary | ICD-10-CM | POA: Diagnosis not present

## 2021-10-18 ENCOUNTER — Other Ambulatory Visit: Payer: Self-pay | Admitting: *Deleted

## 2021-10-18 DIAGNOSIS — Z87891 Personal history of nicotine dependence: Secondary | ICD-10-CM

## 2021-11-02 ENCOUNTER — Encounter: Payer: Self-pay | Admitting: Acute Care

## 2021-11-02 ENCOUNTER — Other Ambulatory Visit: Payer: Self-pay

## 2021-11-02 ENCOUNTER — Ambulatory Visit (INDEPENDENT_AMBULATORY_CARE_PROVIDER_SITE_OTHER): Payer: Medicare Other | Admitting: Acute Care

## 2021-11-02 DIAGNOSIS — Z87891 Personal history of nicotine dependence: Secondary | ICD-10-CM

## 2021-11-02 NOTE — Progress Notes (Signed)
Virtual Visit via Telephone Note  I connected with Teresa Robbins on 11/02/21 at 11:00 AM EST by telephone and verified that I am speaking with the correct person using two identifiers.  Location: Patient:  At home Provider:  [place], [place], [place], [address]    I discussed the limitations, risks, security and privacy concerns of performing an evaluation and management service by telephone and the availability of in person appointments. I also discussed with the patient that there may be a patient responsible charge related to this service. The patient expressed understanding and agreed to proceed.   Shared Decision Making Visit Lung Cancer Screening Program [phone])   Eligibility: Age 71 y.o. Pack Years Smoking History Calculation 84 pack year smoking history (# packs/per year x # years smoked) Recent History of coughing up blood  no Unexplained weight loss? no ( >Than 15 pounds within the last 6 months ) Prior History Lung / other cancer no (Diagnosis within the last 5 years already requiring surveillance chest CT Scans). Smoking Status Former Smoker Former Smokers: Years since quit: < 1 year  Quit Date: 07/2021  Visit Components: Discussion included one or more decision making aids. yes Discussion included risk/benefits of screening. yes Discussion included potential follow up diagnostic testing for abnormal scans. yes Discussion included meaning and risk of over diagnosis. yes Discussion included meaning and risk of False Positives. yes Discussion included meaning of total radiation exposure. yes  Counseling Included: Importance of adherence to annual lung cancer LDCT screening. yes Impact of comorbidities on ability to participate in the program. yes Ability and willingness to under diagnostic treatment. yes  Smoking Cessation Counseling: Current Smokers:  Discussed importance of smoking cessation. yes Information about tobacco cessation classes and  interventions provided to patient. yes Patient provided with "ticket" for LDCT Scan. yes Symptomatic Patient. no  Counseling NA Diagnosis Code: Tobacco Use Z72.0 Asymptomatic Patient yes  Counseling (Intermediate counseling: > three minutes counseling) Z6629 Former Smokers:  Discussed the importance of maintaining cigarette abstinence. yes Diagnosis Code: Personal History of Nicotine Dependence. U76.546 Information about tobacco cessation classes and interventions provided to patient. Yes Patient provided with "ticket" for LDCT Scan. yes Written Order for Lung Cancer Screening with LDCT placed in Epic. Yes (CT Chest Lung Cancer Screening Low Dose W/O CM) [id number] Z12.2-Screening of respiratory organs Z87.891-Personal history of nicotine dependence  I spent 25 minutes of face to face time/virtual visit time  with  Teresa Robbins discussing the risks and benefits of lung cancer screening. We took the time to pause the power point at intervals to allow for questions to be asked and answered to ensure understanding. We discussed that she had taken the single most powerful action possible to decrease her risk of developing lung cancer when she quit smoking. I counseled her to remain smoke free, and to contact me if she ever had the desire to smoke again so that I can provide resources and tools to help support the effort to remain smoke free. We discussed the time and location of the scan, and that either  Doroteo Glassman RN, Joella Prince, RN or I  or I will call / send a letter with the results within  24-72 hours of receiving them. She has the office contact information in the event she needs to speak with me,  she verbalized understanding of all of the above and had no further questions upon leaving the office.     I explained to the patient that there  has been a high incidence of coronary artery disease noted on these exams. I explained that this is a non-gated exam therefore degree or severity cannot  be determined. This patient is on statin therapy. I have asked the patient to follow-up with their PCP regarding any incidental finding of coronary artery disease and management with diet or medication as they feel is clinically indicated. The patient verbalized understanding of the above and had no further questions.     Magdalen Spatz, NP 11/02/2021

## 2021-11-02 NOTE — Patient Instructions (Signed)
Thank you for participating in the [facility]. °It was our pleasure to meet you today. °We will call you with the results of your scan within the next few days. °Your scan will be assigned a Lung RADS category score by the physicians reading the scans.  °This Lung RADS score determines follow up scanning.  °See below for description of categories, and follow up screening recommendations. °We will be in touch to schedule your follow up screening annually or based on recommendations of our providers. °We will fax a copy of your scan results to your Primary Care Physician, or the physician who referred you to the program, to ensure they have the results. °Please call the office if you have any questions or concerns regarding your scanning experience or results.  °Our office number is [phone]. °Please speak with Denise Phelps, RN. She is our Lung Cancer Screening RN. °If she is unavailable when you call, please have the office staff send her a message. She will return your call at her earliest convenience. °Remember, if your scan is normal, we will scan you annually as long as you continue to meet the criteria for the program. (Age 55-77, Current smoker or smoker who has quit within the last 15 years). °If you are a smoker, remember, quitting is the single most powerful action that you can take to decrease your risk of lung cancer and other pulmonary, breathing related problems. °We know quitting is hard, and we are here to help.  °Please let us know if there is anything we can do to help you meet your goal of quitting. °If you are a former smoker, congratulations. We are proud of you! Remain smoke free! °Remember you can refer friends or family members through the number above.  °We will screen them to make sure they meet criteria for the program. °Thank you for helping us take better care of you by participating in Lung Screening. ° °You can receive free nicotine replacement therapy  ( patches, gum or mints) by calling 1-800-QUIT NOW. Please call so we can get you on the path to becoming  a non-smoker. I know it is hard, but you can do this! ° °Lung RADS Categories: ° °Lung RADS 1: no nodules or definitely non-concerning nodules.  °Recommendation is for a repeat annual scan in 12 months. ° °Lung RADS 2:  nodules that are non-concerning in appearance and behavior with a very low likelihood of becoming an active cancer. °Recommendation is for a repeat annual scan in 12 months. ° °Lung RADS 3: nodules that are probably non-concerning , includes nodules with a low likelihood of becoming an active cancer.  Recommendation is for a 6-month repeat screening scan. Often noted after an upper respiratory illness. We will be in touch to make sure you have no questions, and to schedule your 6-month scan. ° °Lung RADS 4 A: nodules with concerning findings, recommendation is most often for a follow up scan in 3 months or additional testing based on our provider's assessment of the scan. We will be in touch to make sure you have no questions and to schedule the recommended 3 month follow up scan. ° °Lung RADS 4 B:  indicates findings that are concerning. We will be in touch with you to schedule additional diagnostic testing based on our provider's  assessment of the scan. ° °Hypnosis for smoking cessation  °[employer] °[phone] ° °Acupuncture for smoking cessation  °[facility] °[phone]  °

## 2021-11-05 ENCOUNTER — Ambulatory Visit (INDEPENDENT_AMBULATORY_CARE_PROVIDER_SITE_OTHER)
Admission: RE | Admit: 2021-11-05 | Discharge: 2021-11-05 | Disposition: A | Discharge status: Home / Self Care | Payer: Medicare Other | Source: Ambulatory Visit | Attending: Acute Care | Admitting: Acute Care

## 2021-11-05 ENCOUNTER — Other Ambulatory Visit: Payer: Self-pay

## 2021-11-05 DIAGNOSIS — Z87891 Personal history of nicotine dependence: Secondary | ICD-10-CM

## 2021-11-07 ENCOUNTER — Telehealth: Payer: Self-pay | Admitting: Acute Care

## 2021-11-07 ENCOUNTER — Other Ambulatory Visit: Payer: Self-pay

## 2021-11-07 DIAGNOSIS — Z87891 Personal history of nicotine dependence: Secondary | ICD-10-CM

## 2021-11-07 NOTE — Telephone Encounter (Signed)
Contacted patient regarding LDCT results.  Reviewed results with patient.  No concerns for lung cancer but incidental finding of thyroid nodule.  Patient is unaware of any previous thyroid issues. Informed patient we will route results to PCP Teresa Redmon, PA to follow up and discuss.  Also asked patient to call PCP if she has not heard from them in the next two weeks.  Patient acknowledged understanding.

## 2021-11-26 DIAGNOSIS — R946 Abnormal results of thyroid function studies: Secondary | ICD-10-CM | POA: Diagnosis not present

## 2021-12-07 DIAGNOSIS — E041 Nontoxic single thyroid nodule: Secondary | ICD-10-CM | POA: Diagnosis not present

## 2021-12-20 DIAGNOSIS — Z9889 Other specified postprocedural states: Secondary | ICD-10-CM | POA: Diagnosis not present

## 2022-01-15 DIAGNOSIS — H25013 Cortical age-related cataract, bilateral: Secondary | ICD-10-CM | POA: Diagnosis not present

## 2022-01-15 DIAGNOSIS — H349 Unspecified retinal vascular occlusion: Secondary | ICD-10-CM | POA: Diagnosis not present

## 2022-03-06 DIAGNOSIS — N6489 Other specified disorders of breast: Secondary | ICD-10-CM | POA: Diagnosis not present

## 2022-03-13 DIAGNOSIS — D485 Neoplasm of uncertain behavior of skin: Secondary | ICD-10-CM | POA: Diagnosis not present

## 2022-03-13 DIAGNOSIS — L821 Other seborrheic keratosis: Secondary | ICD-10-CM | POA: Diagnosis not present

## 2022-03-13 DIAGNOSIS — D225 Melanocytic nevi of trunk: Secondary | ICD-10-CM | POA: Diagnosis not present

## 2022-03-13 DIAGNOSIS — L570 Actinic keratosis: Secondary | ICD-10-CM | POA: Diagnosis not present

## 2022-03-13 DIAGNOSIS — L814 Other melanin hyperpigmentation: Secondary | ICD-10-CM | POA: Diagnosis not present

## 2022-03-15 DIAGNOSIS — E041 Nontoxic single thyroid nodule: Secondary | ICD-10-CM | POA: Diagnosis not present

## 2022-03-15 DIAGNOSIS — E7800 Pure hypercholesterolemia, unspecified: Secondary | ICD-10-CM | POA: Diagnosis not present

## 2022-03-15 DIAGNOSIS — I119 Hypertensive heart disease without heart failure: Secondary | ICD-10-CM | POA: Diagnosis not present

## 2022-03-15 DIAGNOSIS — M810 Age-related osteoporosis without current pathological fracture: Secondary | ICD-10-CM | POA: Diagnosis not present

## 2022-03-21 DIAGNOSIS — J449 Chronic obstructive pulmonary disease, unspecified: Secondary | ICD-10-CM | POA: Diagnosis not present

## 2022-03-21 DIAGNOSIS — H269 Unspecified cataract: Secondary | ICD-10-CM | POA: Diagnosis not present

## 2022-03-21 DIAGNOSIS — H2511 Age-related nuclear cataract, right eye: Secondary | ICD-10-CM | POA: Diagnosis not present

## 2022-04-12 DIAGNOSIS — M25511 Pain in right shoulder: Secondary | ICD-10-CM | POA: Diagnosis not present

## 2022-04-12 DIAGNOSIS — M170 Bilateral primary osteoarthritis of knee: Secondary | ICD-10-CM | POA: Diagnosis not present

## 2022-04-12 DIAGNOSIS — M25512 Pain in left shoulder: Secondary | ICD-10-CM | POA: Diagnosis not present

## 2022-04-12 DIAGNOSIS — G8929 Other chronic pain: Secondary | ICD-10-CM | POA: Diagnosis not present

## 2022-04-18 DIAGNOSIS — I10 Essential (primary) hypertension: Secondary | ICD-10-CM | POA: Diagnosis not present

## 2022-04-18 DIAGNOSIS — H269 Unspecified cataract: Secondary | ICD-10-CM | POA: Diagnosis not present

## 2022-04-18 DIAGNOSIS — H2512 Age-related nuclear cataract, left eye: Secondary | ICD-10-CM | POA: Diagnosis not present

## 2022-05-29 DIAGNOSIS — H5213 Myopia, bilateral: Secondary | ICD-10-CM | POA: Diagnosis not present

## 2022-06-03 DIAGNOSIS — R5383 Other fatigue: Secondary | ICD-10-CM | POA: Diagnosis not present

## 2022-06-03 DIAGNOSIS — R531 Weakness: Secondary | ICD-10-CM | POA: Diagnosis not present

## 2022-06-03 DIAGNOSIS — E871 Hypo-osmolality and hyponatremia: Secondary | ICD-10-CM | POA: Diagnosis not present

## 2022-06-14 DIAGNOSIS — M170 Bilateral primary osteoarthritis of knee: Secondary | ICD-10-CM | POA: Diagnosis not present

## 2022-06-19 DIAGNOSIS — E871 Hypo-osmolality and hyponatremia: Secondary | ICD-10-CM | POA: Diagnosis not present

## 2022-06-21 DIAGNOSIS — M170 Bilateral primary osteoarthritis of knee: Secondary | ICD-10-CM | POA: Diagnosis not present

## 2022-06-28 DIAGNOSIS — M170 Bilateral primary osteoarthritis of knee: Secondary | ICD-10-CM | POA: Diagnosis not present

## 2022-08-30 DIAGNOSIS — M19012 Primary osteoarthritis, left shoulder: Secondary | ICD-10-CM | POA: Diagnosis not present

## 2022-08-30 DIAGNOSIS — M7581 Other shoulder lesions, right shoulder: Secondary | ICD-10-CM | POA: Diagnosis not present

## 2022-08-30 DIAGNOSIS — M7582 Other shoulder lesions, left shoulder: Secondary | ICD-10-CM | POA: Diagnosis not present

## 2022-08-30 DIAGNOSIS — M19011 Primary osteoarthritis, right shoulder: Secondary | ICD-10-CM | POA: Diagnosis not present

## 2022-09-17 ENCOUNTER — Ambulatory Visit
Admission: RE | Admit: 2022-09-17 | Discharge: 2022-09-17 | Disposition: A | Discharge status: Home / Self Care | Payer: Medicare Other | Source: Ambulatory Visit | Attending: Physician Assistant | Admitting: Physician Assistant

## 2022-09-17 ENCOUNTER — Other Ambulatory Visit: Payer: Self-pay | Admitting: Physician Assistant

## 2022-09-17 DIAGNOSIS — R0602 Shortness of breath: Secondary | ICD-10-CM | POA: Diagnosis not present

## 2022-09-17 DIAGNOSIS — Z0000 Encounter for general adult medical examination without abnormal findings: Secondary | ICD-10-CM | POA: Diagnosis not present

## 2022-09-17 DIAGNOSIS — R059 Cough, unspecified: Secondary | ICD-10-CM

## 2022-09-17 DIAGNOSIS — H35039 Hypertensive retinopathy, unspecified eye: Secondary | ICD-10-CM | POA: Diagnosis not present

## 2022-09-17 DIAGNOSIS — M810 Age-related osteoporosis without current pathological fracture: Secondary | ICD-10-CM | POA: Diagnosis not present

## 2022-09-17 DIAGNOSIS — E7800 Pure hypercholesterolemia, unspecified: Secondary | ICD-10-CM | POA: Diagnosis not present

## 2022-09-17 DIAGNOSIS — F339 Major depressive disorder, recurrent, unspecified: Secondary | ICD-10-CM | POA: Diagnosis not present

## 2022-09-18 ENCOUNTER — Other Ambulatory Visit: Payer: Self-pay | Admitting: Physician Assistant

## 2022-09-18 DIAGNOSIS — M810 Age-related osteoporosis without current pathological fracture: Secondary | ICD-10-CM

## 2022-09-26 ENCOUNTER — Ambulatory Visit: Payer: Medicare Other | Admitting: Podiatry

## 2022-09-26 ENCOUNTER — Encounter: Payer: Self-pay | Admitting: Podiatry

## 2022-09-26 ENCOUNTER — Ambulatory Visit (INDEPENDENT_AMBULATORY_CARE_PROVIDER_SITE_OTHER): Payer: Medicare Other

## 2022-09-26 DIAGNOSIS — M2032 Hallux varus (acquired), left foot: Secondary | ICD-10-CM

## 2022-09-26 DIAGNOSIS — M722 Plantar fascial fibromatosis: Secondary | ICD-10-CM

## 2022-09-26 DIAGNOSIS — L600 Ingrowing nail: Secondary | ICD-10-CM

## 2022-09-26 NOTE — Patient Instructions (Signed)

## 2022-09-26 NOTE — Progress Notes (Signed)
Subjective:   Patient ID: Teresa Robbins, female   DOB: 71 y.o.   MRN: [id number]   HPI Patient states she broke her big toe and her big toe removed significantly and it has developed an ingrown toenail along with the position of the big toe.  States she thinks most of her pain comes from the ingrown toenail but the position of the toe can certainly be an issue.  Patient does not smoke likes to be active   Review of Systems  All other systems reviewed and are negative.       Objective:  Physical Exam Vitals and nursing note reviewed.  Constitutional:      Appearance: She is well-developed.  Pulmonary:     Effort: Pulmonary effort is normal.  Musculoskeletal:        General: Normal range of motion.  Skin:    General: Skin is warm.  Neurological:     Mental Status: She is alert.     Neurovascular status intact muscle strength found to be adequate range of motion within normal limits.  Patient is found to have a significant hallux varus deformity left which is also caused the second toe to move history of surgery on the left foot done years ago with a relative flexible deformity.  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  Ingrown toenail deformity left hallux medial border painful when pressed making shoe gear difficult     Plan:  H&P x-ray reviewed discussed condition at great length.  Eventually fusion of the first MPJ may be necessary for this but at this point organ to correct the ingrown if that relieves her pain that she will not need surgery.  She understands no guarantee this will fix everything and patient wants procedure I went ahead today explained ingrown toenail removal and surgery and risk patient wants this done and signed consent form.  I infiltrated the left hallux 60 mg like Marcaine mixture sterile prep done and using sterile  instrumentation remove the border exposed matrix applied phenol 3 applications 30 seconds followed by alcohol by sterile dressing gave instructions on soaks leave dressing on 24 hours take off earlier if throbbing and encouraged her to call questions concerns which may arise during the healing.

## 2022-09-30 DIAGNOSIS — R300 Dysuria: Secondary | ICD-10-CM | POA: Diagnosis not present

## 2022-10-16 DIAGNOSIS — Z1211 Encounter for screening for malignant neoplasm of colon: Secondary | ICD-10-CM | POA: Diagnosis not present

## 2022-11-05 ENCOUNTER — Ambulatory Visit
Admission: RE | Admit: 2022-11-05 | Discharge: 2022-11-05 | Disposition: A | Discharge status: Home / Self Care | Payer: Medicare Other | Source: Ambulatory Visit | Attending: Physician Assistant | Admitting: Physician Assistant

## 2022-11-05 DIAGNOSIS — J8410 Pulmonary fibrosis, unspecified: Secondary | ICD-10-CM | POA: Diagnosis not present

## 2022-11-05 DIAGNOSIS — I2510 Atherosclerotic heart disease of native coronary artery without angina pectoris: Secondary | ICD-10-CM | POA: Diagnosis not present

## 2022-11-05 DIAGNOSIS — J432 Centrilobular emphysema: Secondary | ICD-10-CM | POA: Diagnosis not present

## 2022-11-05 DIAGNOSIS — Z87891 Personal history of nicotine dependence: Secondary | ICD-10-CM | POA: Diagnosis not present

## 2022-11-07 ENCOUNTER — Other Ambulatory Visit: Payer: Self-pay

## 2022-11-07 DIAGNOSIS — Z87891 Personal history of nicotine dependence: Secondary | ICD-10-CM

## 2022-11-07 DIAGNOSIS — Z122 Encounter for screening for malignant neoplasm of respiratory organs: Secondary | ICD-10-CM

## 2022-11-20 DIAGNOSIS — H3500 Unspecified background retinopathy: Secondary | ICD-10-CM | POA: Diagnosis not present

## 2022-11-20 DIAGNOSIS — Z961 Presence of intraocular lens: Secondary | ICD-10-CM | POA: Diagnosis not present

## 2023-01-29 DIAGNOSIS — F339 Major depressive disorder, recurrent, unspecified: Secondary | ICD-10-CM | POA: Diagnosis not present

## 2023-01-29 DIAGNOSIS — E7800 Pure hypercholesterolemia, unspecified: Secondary | ICD-10-CM | POA: Diagnosis not present

## 2023-01-29 DIAGNOSIS — R197 Diarrhea, unspecified: Secondary | ICD-10-CM | POA: Diagnosis not present

## 2023-01-29 DIAGNOSIS — I10 Essential (primary) hypertension: Secondary | ICD-10-CM | POA: Diagnosis not present

## 2023-01-30 DIAGNOSIS — R197 Diarrhea, unspecified: Secondary | ICD-10-CM | POA: Diagnosis not present

## 2023-02-06 DIAGNOSIS — Z421 Encounter for breast reconstruction following mastectomy: Secondary | ICD-10-CM | POA: Diagnosis not present

## 2023-03-07 ENCOUNTER — Ambulatory Visit
Admission: RE | Admit: 2023-03-07 | Discharge: 2023-03-07 | Disposition: A | Discharge status: Home / Self Care | Payer: Medicare Other | Source: Ambulatory Visit | Attending: Physician Assistant | Admitting: Physician Assistant

## 2023-03-07 DIAGNOSIS — J449 Chronic obstructive pulmonary disease, unspecified: Secondary | ICD-10-CM | POA: Diagnosis not present

## 2023-03-07 DIAGNOSIS — M810 Age-related osteoporosis without current pathological fracture: Secondary | ICD-10-CM

## 2023-03-07 DIAGNOSIS — I700 Atherosclerosis of aorta: Secondary | ICD-10-CM | POA: Diagnosis not present

## 2023-03-07 DIAGNOSIS — R059 Cough, unspecified: Secondary | ICD-10-CM | POA: Diagnosis not present

## 2023-03-07 DIAGNOSIS — N958 Other specified menopausal and perimenopausal disorders: Secondary | ICD-10-CM | POA: Diagnosis not present

## 2023-03-07 DIAGNOSIS — E2839 Other primary ovarian failure: Secondary | ICD-10-CM | POA: Diagnosis not present

## 2023-03-19 ENCOUNTER — Ambulatory Visit: Payer: Medicare Other | Admitting: Physician Assistant

## 2023-03-19 ENCOUNTER — Encounter: Payer: Self-pay | Admitting: Physician Assistant

## 2023-03-19 VITALS — BP 134/78 | HR 75 | Ht 64.0 in | Wt 201.0 lb

## 2023-03-19 DIAGNOSIS — Z800 Family history of malignant neoplasm of digestive organs: Secondary | ICD-10-CM

## 2023-03-19 DIAGNOSIS — R195 Other fecal abnormalities: Secondary | ICD-10-CM

## 2023-03-19 DIAGNOSIS — K5900 Constipation, unspecified: Secondary | ICD-10-CM

## 2023-03-19 DIAGNOSIS — Z86010 Personal history of colonic polyps: Secondary | ICD-10-CM

## 2023-03-19 DIAGNOSIS — K219 Gastro-esophageal reflux disease without esophagitis: Secondary | ICD-10-CM | POA: Diagnosis not present

## 2023-03-19 MED ORDER — OMEPRAZOLE 20 MG PO CPDR: 20.0000 mg | DELAYED_RELEASE_CAPSULE | Freq: Every day | ORAL | 5 refills | Status: DC

## 2023-03-19 NOTE — Progress Notes (Signed)
Chief Complaint: Positive Cologuard, family history of colon cancer, change in bowel habits and indigestion  HPI:    Teresa Robbins is a 72 year old female with a past medical history as listed below including IBS, known to Dr. Lavon Paganini, who was referred to me by Milus Height, PA for a complaint of positive Cologuard, family history of colon cancer, personal history of adenomatous polyps, change in bowel habits and indigestion.      12/14/2010 colonoscopy for family history of colon cancer in apparent and history of tubular adenomas with 1 diminutive polyp in the rectum, mild diverticulosis and otherwise normal.  Repeat recommended in 5 years given family history.    02/18/2017 patient seen in clinic by Dr. Lavon Paganini at that time discussed chronic constipation.  Also had grade 2-3 internal hemorrhoids at that time.  She started on Linzess 145 mcg daily and Benefiber.  Told she needed to recall colon in March.    Today, the patient tells me that back in April she had a change in bowel habits from her usual constipation to diarrhea and cramping.  Tells me prior to that she would have maybe a day at a time where she would have some cramping abdominal pain and diarrhea but this lasted for 5 weeks, now she is back to her regular constipation.  She does use occasional MiraLAX but tells me she never feels fully empty.  Apparently tried daily full dose MiraLAX in the past which would give her liquid stools and she cannot exercise at all.  Also tried fiber supplements which apparently caused too much gas for her.    Also discusses that around the time she had diarrhea and cramping in April she also started with indigestion.  Tells me she does not have true heartburn or reflux but eats and feels full up to her chest.  Has been using over-the-counter Famotidine 20 mg twice daily and Tums in between which does help the symptoms, but this is completely new for her.    Denies fever, chills, weight loss, blood in her stool,  nausea or vomiting.     Past Medical History:  Diagnosis Date   Abdominal pain    Chest pain    Constipation    Depression    Diverticulosis    Fainting    Gallstones    HLD (hyperlipidemia)    Hx of adenomatous colonic polyps 2001   Hypercholesterolemia    Hyperplastic colon polyp 2012   Hypertension    IBS (irritable bowel syndrome)    Obesity    Retinopathy    Weakness     Past Surgical History:  Procedure Laterality Date   BREAST LUMPECTOMY Right 1997 - approximate   BUNIONECTOMY Bilateral 1995 - approximate   CESAREAN SECTION  1987   w/tubal ligation   CHOLECYSTECTOMY     MASTECTOMY Bilateral     Current Outpatient Medications  Medication Sig Dispense Refill   amLODipine (NORVASC) 10 MG tablet Take 10 mg by mouth daily.     aspirin 81 MG tablet Take 81 mg by mouth every other day.      Calcium Carb-Cholecalciferol (CALCIUM+D3 PO) Take 1 tablet by mouth daily with breakfast.     ibandronate (BONIVA) 150 MG tablet Take 150 mg by mouth every 30 (thirty) days. Take in the morning with a full glass of water, on an empty stomach, and do not take anything else by mouth or lie down for the next 30 min.     losartan (COZAAR) 100  MG tablet Take 100 mg by mouth daily.     Multiple Vitamins-Minerals (MULTIVITAMIN PO) Take 1 tablet by mouth daily with breakfast.      PARoxetine (PAXIL) 10 MG tablet Take 10 mg by mouth every morning.     sodium chloride (V-R NASAL SPRAY SALINE) 0.65 % nasal spray Place 1-2 sprays into both nostrils as needed for congestion.     No current facility-administered medications for this visit.    Allergies as of 03/19/2023 - Review Complete 09/26/2022  Allergen Reaction Noted   Morphine Itching 03/05/2016   Sulfamethoxazole-trimethoprim Other (See Comments) 07/26/2016    Family History  Problem Relation Age of Onset   Colon polyps Mother    Depression Mother    COPD Mother    Colon polyps Father    Prostate cancer Father    Hypertension  Father    Breast cancer Daughter    Leukemia Daughter    Hypertension Daughter    Breast cancer Paternal Aunt    Breast cancer Cousin        breast - on mom's side   Depression Brother    Hypertension Brother    Stroke Paternal Grandmother    Hypertension Sister    Hypertension Brother     Social History   Socioeconomic History   Marital status: Widowed    Spouse name: Not on file   Number of children: 3   Years of education: Not on file   Highest education level: Not on file  Occupational History   Occupation: Engineer, mining office    Employer: [employer]  Tobacco Use   Smoking status: Former    Packs/day: 1.50    Years: 56.00    Additional pack years: 0.00    Total pack years: 84.00    Types: Cigarettes    Quit date: 07/2021    Years since quitting: 1.6   Smokeless tobacco: Never  Substance and Sexual Activity   Alcohol use: No   Drug use: No   Sexual activity: Not on file  Other Topics Concern   Not on file  Social History Narrative   Not on file   Social Determinants of Health   Financial Resource Strain: Not on file  Food Insecurity: Not on file  Transportation Needs: Not on file  Physical Activity: Not on file  Stress: Not on file  Social Connections: Not on file  Intimate Partner Violence: Not on file    Review of Systems:    Constitutional: No weight loss, fever or chills Skin: No rash Cardiovascular: No chest pain   Respiratory: No SOB Gastrointestinal: See HPI and otherwise negative Genitourinary: No dysuria Neurological: No headache, dizziness or syncope Musculoskeletal: No new muscle or joint pain Hematologic: No bleeding  Psychiatric: No history of depression or anxiety   Physical Exam:  Vital signs: BP 134/78 (BP Location: Right Arm, Patient Position: Sitting)   Pulse 75   Ht 5\' 4"  (1.626 m)   Wt 201 lb (91.2 kg)   SpO2 96%   BMI 34.50 kg/m    Constitutional:   Pleasant elderly Caucasian female appears to be  in NAD, Well developed, Well nourished, alert and cooperative Head:  Normocephalic and atraumatic. Eyes:   PEERL, EOMI. No icterus. Conjunctiva pink. Ears:  Normal auditory acuity. Neck:  Supple Throat: Oral cavity and pharynx without inflammation, swelling or lesion.  Respiratory: Respirations even and unlabored. Lungs clear to auscultation bilaterally.   No wheezes, crackles, or rhonchi.  Cardiovascular: Normal S1,  S2. No MRG. Regular rate and rhythm. No peripheral edema, cyanosis or pallor.  Gastrointestinal:  Soft, nondistended, nontender. No rebound or guarding.decreased BS all four quadrants. No appreciable masses or hepatomegaly. Rectal:  Not performed.  Msk:  Symmetrical without gross deformities. Without edema, no deformity or joint abnormality.  Neurologic:  Alert and  oriented x4;  grossly normal neurologically.  Skin:   Dry and intact without significant lesions or rashes. Psychiatric: Demonstrates good judgement and reason without abnormal affect or behaviors.  Patient seen at [facility] primary- recent Cologuard positive in January  Assessment: 1.  Positive Cologuard/family history of colon cancer/personal history of adenomatous polyps: Patient is overdue for a colonoscopy for many reasons, last in 2012 with recommendations repeat in 5 years 2.  Constipation: Does get better with MiraLAX as needed, but this is sometimes too much when she takes a couple of doses at a time, has never tried this regularly, describes fiber gave her gas in the past 3.  Indigestion and early satiety: Over the past 3 months, some better with Famotidine and Tums; likely gastritis  Plan: 1.  Scheduled patient for diagnostic EGD and colonoscopy with Dr. Lavon Paganini in the [facility].  Did provide the patient a detailed list of risks for the procedures and she agrees to proceed. Patient is appropriate for endoscopic procedure(s) in the ambulatory ([facility]) setting.  2.  Patient will have a 2-day bowel prep 3.  Prescribed  Reglan 10 mg, #2, 1 tab 20-30 minutes before the first half of prep and repeat for the second half 4.  Recommend the patient start MiraLAX daily, discussed titration of this.  She may only need to do half a dose a day to keep things moving.  Also discussed daily Citrucel which is less gas causing than other fiber supplementation. 5.  Prescribed Omeprazole 20 mg daily, 30-60 minutes before breakfast #30 with 5 refills 6.  Patient can continue Famotidine 20 mg nightly 7.  Patient to follow in clinic per recommendations from Dr. Lavon Paganini after time of procedures.  Hyacinth Meeker, PA-C [facility] 03/19/2023, 10:31 AM  Cc: Milus Height, PA

## 2023-03-19 NOTE — Patient Instructions (Addendum)
You have been scheduled for an endoscopy. Please follow written instructions given to you at your visit today. If you use inhalers (even only as needed), please bring them with you on the day of your procedure. (03/27/2023)   You have been scheduled for a colonoscopy. Please follow written instructions given to you at your visit today.  Please pick up your prep supplies at the pharmacy within the next 1-3 days. If you use inhalers (even only as needed), please bring them with you on the day of your procedure. (06/12/2023)   We have sent the following medications to your pharmacy for you to pick up at your convenience: Omeprazole 20 mg  Reglan 10 mg  Continue Famotidine 20 mg every night at bedtime  Due to recent changes in healthcare laws, you may see the results of your imaging and laboratory studies on MyChart before your provider has had a chance to review them.  We understand that in some cases there may be results that are confusing or concerning to you. Not all laboratory results come back in the same time frame and the provider may be waiting for multiple results in order to interpret others.  Please give [place] 48 hours in order for your provider to thoroughly review all the results before contacting the office for clarification of your results.   _______________________________________________________  If your blood pressure at your visit was 140/90 or greater, please contact your primary care physician to follow up on this.  _______________________________________________________  If you are age 29 or older, your body mass index should be between 23-30. Your Body mass index is 34.5 kg/m. If this is out of the aforementioned range listed, please consider follow up with your Primary Care Provider.  If you are age 82 or younger, your body mass index should be between 19-25. Your Body mass index is 34.5 kg/m. If this is out of the aformentioned range listed, please consider follow up with  your Primary Care Provider.   ________________________________________________________  The [facility] providers would like to encourage you to use [facility] to communicate with providers for non-urgent requests or questions.  Due to long hold times on the telephone, sending your provider a message by [facility] may be a faster and more efficient way to get a response.  Please allow 48 business hours for a response.  Please remember that this is for non-urgent requests.  _______________________________________________________   I appreciate the  opportunity to care for you  Thank You   Jacelyn Grip

## 2023-03-27 ENCOUNTER — Ambulatory Visit (AMBULATORY_SURGERY_CENTER): Payer: Medicare Other | Admitting: Gastroenterology

## 2023-03-27 ENCOUNTER — Encounter: Payer: Self-pay | Admitting: Gastroenterology

## 2023-03-27 VITALS — BP 137/80 | HR 82 | Temp 96.9°F | Resp 12 | Ht 64.0 in | Wt 201.0 lb

## 2023-03-27 DIAGNOSIS — R1310 Dysphagia, unspecified: Secondary | ICD-10-CM

## 2023-03-27 DIAGNOSIS — K219 Gastro-esophageal reflux disease without esophagitis: Secondary | ICD-10-CM

## 2023-03-27 MED ORDER — SUTAB 1479-225-188 MG PO TABS: 12.0000 | ORAL_TABLET | ORAL | 0 refills | Status: DC

## 2023-03-27 MED ORDER — SODIUM CHLORIDE 0.9 % IV SOLN: 500.0000 mL | Freq: Once | INTRAVENOUS | Status: DC

## 2023-03-27 MED ORDER — ONDANSETRON HCL 4 MG PO TABS: 4.0000 mg | ORAL_TABLET | Freq: Three times a day (TID) | ORAL | 1 refills | Status: AC | PRN

## 2023-03-27 MED ORDER — PANTOPRAZOLE SODIUM 20 MG PO TBEC
20.0000 mg | DELAYED_RELEASE_TABLET | Freq: Every day | ORAL | 3 refills | Status: AC
Start: 2023-03-27 — End: ?

## 2023-03-27 NOTE — Progress Notes (Signed)
Last colonoscopy in 2012 with removal of hyperplastic polyp. Patient had normal colonoscopy in 2006 and prior to that in 2001 had a diminutive adenomatous colon polyp (0.2-0.4 cm based on pathology report) removed Family history positive for colon polyps, not colon cancer She was recommended to undergo recall colonoscopy in March 2022, is past due  Cologaurd positive  Nonspecific dyspepsia and GERD symptoms.   Will benefit from colonoscopy first and +/- EGD  Reviewed and agree with documentation and assessment and plan. Iona Beard , MD

## 2023-03-27 NOTE — Patient Instructions (Addendum)
-   Continue present medications. - Follow an antireflux regimen. - Use Protonix (pantoprazole) 20 mg PO daily. - Schedule colonoscopy for Positive Cologuard, will plan to do procedure tomorrow with Dr Tomasa Rand - Clear liquid diet and start bowel prep with Suprep   YOU HAD AN ENDOSCOPIC PROCEDURE TODAY AT THE [facility]:   Refer to the procedure report that was given to you for any specific questions about what was found during the examination.  If the procedure report does not answer your questions, please call your gastroenterologist to clarify.  If you requested that your care partner not be given the details of your procedure findings, then the procedure report has been included in a sealed envelope for you to review at your convenience later.  YOU SHOULD EXPECT: Some feelings of bloating in the abdomen. Passage of more gas than usual.  Walking can help get rid of the air that was put into your GI tract during the procedure and reduce the bloating. If you had a lower endoscopy (such as a colonoscopy or flexible sigmoidoscopy) you may notice spotting of blood in your stool or on the toilet paper. If you underwent a bowel prep for your procedure, you may not have a normal bowel movement for a few days.  Please Note:  You might notice some irritation and congestion in your nose or some drainage.  This is from the oxygen used during your procedure.  There is no need for concern and it should clear up in a day or so.  SYMPTOMS TO REPORT IMMEDIATELY:  Following upper endoscopy (EGD)  Vomiting of blood or coffee ground material  New chest pain or pain under the shoulder blades  Painful or persistently difficult swallowing  New shortness of breath  Fever of 100F or higher  Black, tarry-looking stools  For urgent or emergent issues, a gastroenterologist can be reached at any hour by calling [phone]. Do not use MyChart messaging for urgent concerns.    DIET:  We do  recommend a small meal at first, but then you may proceed to your regular diet.  Drink plenty of fluids but you should avoid alcoholic beverages for 24 hours.  ACTIVITY:  You should plan to take it easy for the rest of today and you should NOT DRIVE or use heavy machinery until tomorrow (because of the sedation medicines used during the test).    FOLLOW UP: Our staff will call the number listed on your records the next business day following your procedure.  We will call around 7:15- 8:00 am to check on you and address any questions or concerns that you may have regarding the information given to you following your procedure. If we do not reach you, we will leave a message.     If any biopsies were taken you will be contacted by phone or by letter within the next 1-3 weeks.  Please call us at [phone] if you have not heard about the biopsies in 3 weeks.    SIGNATURES/CONFIDENTIALITY: You and/or your care partner have signed paperwork which will be entered into your electronic medical record.  These signatures attest to the fact that that the information above on your After Visit Summary has been reviewed and is understood.  Full responsibility of the confidentiality of this discharge information lies with you and/or your care-partner.

## 2023-03-27 NOTE — Progress Notes (Signed)
Report to PACU, RN, vss, BBS= Clear.  

## 2023-03-27 NOTE — Op Note (Signed)
[facility] Patient Name: Teresa Robbins Procedure Date: 03/27/2023 10:14 AM MRN: [id number] Endoscopist: Napoleon Form , MD, [id number] Age: 72 Referring MD:  Date of Birth: 1950-11-06 Gender: Female Account #: [id number] Procedure:                Upper GI endoscopy Indications:              Dyspepsia, Esophageal reflux symptoms that persist                            despite appropriate therapy Medicines:                Monitored Anesthesia Care Procedure:                Pre-Anesthesia Assessment:                           - Prior to the procedure, a History and Physical                            was performed, and patient medications and                            allergies were reviewed. The patient's tolerance of                            previous anesthesia was also reviewed. The risks                            and benefits of the procedure and the sedation                            options and risks were discussed with the patient.                            All questions were answered, and informed consent                            was obtained. Prior Anticoagulants: The patient has                            taken no anticoagulant or antiplatelet agents. ASA                            Grade Assessment: II - A patient with mild systemic                            disease. After reviewing the risks and benefits,                            the patient was deemed in satisfactory condition to                            undergo the procedure.  After obtaining informed consent, the endoscope was                            passed under direct vision. Throughout the                            procedure, the patient's blood pressure, pulse, and                            oxygen saturations were monitored continuously. The                            GIF [id number] #[id number] was introduced through the                            mouth, and advanced to  the second part of duodenum.                            The upper GI endoscopy was accomplished without                            difficulty. The patient tolerated the procedure                            well. Scope In: Scope Out: Findings:                 The Z-line was found 40 cm from the incisors.                           No gross lesions were noted in the entire esophagus.                           A 2 cm hiatal hernia was present.                           The cardia and gastric fundus were normal on                            retroflexion.                           The examined duodenum was normal. Complications:            No immediate complications. Estimated Blood Loss:     Estimated blood loss was minimal. Impression:               - Z-line, 40 cm from the incisors.                           - No gross lesions in the entire esophagus.                           - 2 cm hiatal hernia.                           -  Normal examined duodenum.                           - No specimens collected. Recommendation:           - Patient has a contact number available for                            emergencies. The signs and symptoms of potential                            delayed complications were discussed with the                            patient. Return to normal activities tomorrow.                            Written discharge instructions were provided to the                            patient.                           - Continue present medications.                           - Follow an antireflux regimen.                           - Use Protonix (pantoprazole) 20 mg PO daily.                           - Schedule colonoscopy for Positive Cologuard, will                            plan to do procedure tomorrow with Dr Tomasa Rand                           - Clear liquid diet and start bowel prep with                            Donna Bernard, MD 03/27/2023 10:46:02  AM This report has been signed electronically.

## 2023-03-27 NOTE — Progress Notes (Signed)
[facility] History and Physical   Primary Care Physician:  Milus Height, PA   Reason for Procedure:  GERD, dyspepsi  Plan:    EGD with possible interventions as needed     HPI: Teresa Robbins is a very pleasant 72 y.o. female here for EGD for persistent GERD symptoms and dyspepsia. She has positive cologaurd, last colonoscopy in 2012 with hyperplastic polyps, prior to that normal colonoscopy in 2006 and 1 diminutive adenomatous polyp removed in 2001 by Dr Juanda Chance, for some reason, colonoscopy is not scheduled until Aug. She was seen by Hyacinth Meeker on 03/19/23   The risks and benefits as well as alternatives of endoscopic procedure(s) have been discussed and reviewed. All questions answered. The patient agrees to proceed.    Past Medical History:  Diagnosis Date   Abdominal pain    Arthritis    Chest pain    Constipation    COPD (chronic obstructive pulmonary disease) (HCC)    Depression    Diverticulosis    Fainting    Gallstones    GERD (gastroesophageal reflux disease)    HLD (hyperlipidemia)    Hx of adenomatous colonic polyps 2001   Hypercholesterolemia    Hyperplastic colon polyp 2012   Hypertension    IBS (irritable bowel syndrome)    Obesity    Retinopathy    Weakness     Past Surgical History:  Procedure Laterality Date   BREAST LUMPECTOMY Right 1997 - approximate   BUNIONECTOMY Bilateral 1995 - approximate   CESAREAN SECTION  1987   w/tubal ligation   CHOLECYSTECTOMY     MASTECTOMY Bilateral     Prior to Admission medications   Medication Sig Start Date End Date Taking? Authorizing Provider  amLODipine (NORVASC) 10 MG tablet Take 10 mg by mouth daily.   Yes [provider]  aspirin 81 MG tablet Take 81 mg by mouth every other day.    Yes [provider]  atorvastatin (LIPITOR) 10 MG tablet Take 10 mg by mouth daily.   Yes [provider]  Biotin 10 MG CAPS Take by mouth.   Yes [provider]   Budeson-Glycopyrrol-Formoterol (BREZTRI AEROSPHERE) 160-9-4.8 MCG/ACT AERO Inhale into the lungs.   Yes [provider]  Calcium Carb-Cholecalciferol (CALCIUM+D3 PO) Take 1 tablet by mouth daily with breakfast.   Yes [provider]  losartan (COZAAR) 100 MG tablet Take 100 mg by mouth daily.   Yes [provider]  Multiple Vitamins-Minerals (MULTIVITAMIN PO) Take 1 tablet by mouth daily with breakfast.    Yes [provider]  omeprazole (PRILOSEC) 20 MG capsule Take 1 capsule (20 mg total) by mouth daily. Take 1 capsule 1 30-60 minutes before breakfast or dinner 03/19/23  Yes Unk Lightning, PA  PARoxetine (PAXIL) 10 MG tablet Take 10 mg by mouth every morning.   Yes [provider]  doxycycline (ADOXA) 100 MG tablet Take 100 mg by mouth 2 (two) times daily. Patient not taking: Reported on 03/27/2023 03/07/23   [provider]  sodium chloride (V-R NASAL SPRAY SALINE) 0.65 % nasal spray Place 1-2 sprays into both nostrils as needed for congestion. Patient not taking: Reported on 03/19/2023    [provider]    Current Outpatient Medications  Medication Sig Dispense Refill   amLODipine (NORVASC) 10 MG tablet Take 10 mg by mouth daily.     aspirin 81 MG tablet Take 81 mg by mouth every other day.      atorvastatin (LIPITOR)  10 MG tablet Take 10 mg by mouth daily.     Biotin 10 MG CAPS Take by mouth.     Budeson-Glycopyrrol-Formoterol (BREZTRI AEROSPHERE) 160-9-4.8 MCG/ACT AERO Inhale into the lungs.     Calcium Carb-Cholecalciferol (CALCIUM+D3 PO) Take 1 tablet by mouth daily with breakfast.     losartan (COZAAR) 100 MG tablet Take 100 mg by mouth daily.     Multiple Vitamins-Minerals (MULTIVITAMIN PO) Take 1 tablet by mouth daily with breakfast.      omeprazole (PRILOSEC) 20 MG capsule Take 1 capsule (20 mg total) by mouth daily. Take 1 capsule 1 30-60 minutes before breakfast or dinner 30 capsule 5   PARoxetine (PAXIL) 10 MG  tablet Take 10 mg by mouth every morning.     doxycycline (ADOXA) 100 MG tablet Take 100 mg by mouth 2 (two) times daily. (Patient not taking: Reported on 03/27/2023)     sodium chloride (V-R NASAL SPRAY SALINE) 0.65 % nasal spray Place 1-2 sprays into both nostrils as needed for congestion. (Patient not taking: Reported on 03/19/2023)     Current Facility-Administered Medications  Medication Dose Route Frequency Provider Last Rate Last Admin   0.9 %  sodium chloride infusion  500 mL Intravenous Once Napoleon Form, MD        Allergies as of 03/27/2023 - Review Complete 03/27/2023  Allergen Reaction Noted   Morphine Itching 03/05/2016   Sulfamethoxazole-trimethoprim Other (See Comments) 07/26/2016    Family History  Problem Relation Age of Onset   Colon polyps Mother    Depression Mother    COPD Mother    Colon polyps Father    Prostate cancer Father    Hypertension Father    Hypertension Sister    Depression Brother    Hypertension Brother    Hypertension Brother    Breast cancer Paternal Aunt    Stroke Paternal Grandmother    Breast cancer Daughter    Leukemia Daughter    Hypertension Daughter    Breast cancer Cousin        breast - on mom's side   Diabetes Neg Hx    Stomach cancer Neg Hx     Social History   Socioeconomic History   Marital status: Widowed    Spouse name: Not on file   Number of children: 3   Years of education: Not on file   Highest education level: Not on file  Occupational History   Occupation: Engineer, mining office    Employer: [employer]  Tobacco Use   Smoking status: Former    Packs/day: 1.50    Years: 56.00    Additional pack years: 0.00    Total pack years: 84.00    Types: Cigarettes    Quit date: 07/2021    Years since quitting: 1.7   Smokeless tobacco: Never  Vaping Use   Vaping Use: Never used  Substance and Sexual Activity   Alcohol use: No   Drug use: No   Sexual activity: Not on file  Other Topics  Concern   Not on file  Social History Narrative   Not on file   Social Determinants of Health   Financial Resource Strain: Not on file  Food Insecurity: Not on file  Transportation Needs: Not on file  Physical Activity: Not on file  Stress: Not on file  Social Connections: Not on file  Intimate Partner Violence: Not on file    Review of Systems:  All other review of systems negative except as  mentioned in the HPI.  Physical Exam: Vital signs in last 24 hours: Blood Pressure (Abnormal) 162/90   Pulse 78   Temperature (Abnormal) 96.9 F (36.1 C)   Height 5\' 4"  (1.626 m)   Weight 201 lb (91.2 kg)   Oxygen Saturation 96%   Body Mass Index 34.50 kg/m  General:   Alert, NAD Lungs:  Clear .   Heart:  Regular rate and rhythm Abdomen:  Soft, nontender and nondistended. Neuro/Psych:  Alert and cooperative. Normal mood and affect. A and O x 3  Reviewed labs, radiology imaging, old records and pertinent past GI work up  Patient is appropriate for planned procedure(s) and anesthesia in an ambulatory setting   K. Scherry Ran , MD [phone]

## 2023-03-28 ENCOUNTER — Telehealth: Payer: Self-pay | Admitting: *Deleted

## 2023-03-28 ENCOUNTER — Ambulatory Visit (AMBULATORY_SURGERY_CENTER): Payer: Medicare Other | Admitting: Gastroenterology

## 2023-03-28 ENCOUNTER — Encounter: Payer: Self-pay | Admitting: Gastroenterology

## 2023-03-28 VITALS — BP 129/61 | HR 74 | Temp 97.7°F | Resp 12 | Ht 64.0 in | Wt 201.0 lb

## 2023-03-28 DIAGNOSIS — D125 Benign neoplasm of sigmoid colon: Secondary | ICD-10-CM

## 2023-03-28 DIAGNOSIS — K635 Polyp of colon: Secondary | ICD-10-CM | POA: Diagnosis not present

## 2023-03-28 DIAGNOSIS — R195 Other fecal abnormalities: Secondary | ICD-10-CM

## 2023-03-28 DIAGNOSIS — Z1211 Encounter for screening for malignant neoplasm of colon: Secondary | ICD-10-CM | POA: Diagnosis not present

## 2023-03-28 MED ORDER — SODIUM CHLORIDE 0.9 % IV SOLN: 500.0000 mL | Freq: Once | INTRAVENOUS | Status: DC

## 2023-03-28 NOTE — Progress Notes (Signed)
Called to room to assist during endoscopic procedure.  Patient ID and intended procedure confirmed with present staff. Received instructions for my participation in the procedure from the performing physician.  

## 2023-03-28 NOTE — Op Note (Signed)
[facility] Patient Name: Teresa Robbins Procedure Date: 03/28/2023 10:45 AM MRN: [id number] Endoscopist: Lorin Picket E. Tomasa Robbins , MD, [id number] Age: 72 Referring MD:  Date of Birth: 1951/06/17 Gender: Female Account #: [id number] Procedure:                Colonoscopy Indications:              Positive Cologuard test Medicines:                Monitored Anesthesia Care Procedure:                Pre-Anesthesia Assessment:                           - Prior to the procedure, a History and Physical                            was performed, and patient medications and                            allergies were reviewed. The patient's tolerance of                            previous anesthesia was also reviewed. The risks                            and benefits of the procedure and the sedation                            options and risks were discussed with the patient.                            All questions were answered, and informed consent                            was obtained. Prior Anticoagulants: The patient has                            taken no anticoagulant or antiplatelet agents. ASA                            Grade Assessment: III - A patient with severe                            systemic disease. After reviewing the risks and                            benefits, the patient was deemed in satisfactory                            condition to undergo the procedure.                           After obtaining informed consent, the colonoscope  was passed under direct vision. Throughout the                            procedure, the patient's blood pressure, pulse, and                            oxygen saturations were monitored continuously. The                            Olympus CF-HQ190L ([id number]) Colonoscope was                            introduced through the anus and advanced to the the                            cecum, identified by  appendiceal orifice and                            ileocecal valve. The colonoscopy was unusually                            difficult due to a redundant colon, significant                            looping and a tortuous colon. Successful completion                            of the procedure was aided by using manual                            pressure, withdrawing and reinserting the scope and                            straightening and shortening the scope to obtain                            bowel loop reduction. The patient tolerated the                            procedure well. The quality of the bowel                            preparation was good. The ileocecal valve,                            appendiceal orifice, and rectum were photographed.                            The bowel preparation used was SUTAB via split dose                            instruction. Scope In: 11:02:22 AM Scope Out: 11:26:24 AM Scope Withdrawal Time: 0 hours 11 minutes 39 seconds  Total Procedure Duration: 0  hours 24 minutes 2 seconds  Findings:                 Hemorrhoids were found on perianal exam.                           The digital rectal exam was normal. Pertinent                            negatives include normal sphincter tone and no                            palpable rectal lesions.                           A 10 mm polyp was found in the sigmoid colon. The                            polyp was sessile. The polyp was removed with a                            cold snare. Resection and retrieval were complete.                            Estimated blood loss was minimal.                           Many large-mouthed and small-mouthed diverticula                            were found in the sigmoid colon and descending                            colon.                           The exam was otherwise normal throughout the                            examined colon.                            Non-bleeding prolapsed internal hemorrhoids were                            found during perianal exam. The hemorrhoids were                            Grade III (internal hemorrhoids that prolapse but                            require manual reduction). Retroflexion in the                            rectum was attempted multiple times but was  unsuccessful due to shortened rectal vault Complications:            No immediate complications. Estimated Blood Loss:     Estimated blood loss was minimal. Impression:               - Hemorrhoids found on perianal exam.                           - One 10 mm polyp in the sigmoid colon, removed                            with a cold snare. Resected and retrieved.                           - Severe diverticulosis in the sigmoid colon and in                            the descending colon.                           - Non-bleeding prolapsed internal hemorrhoids. Recommendation:           - Patient has a contact number available for                            emergencies. The signs and symptoms of potential                            delayed complications were discussed with the                            patient. Return to normal activities tomorrow.                            Written discharge instructions were provided to the                            patient.                           - Resume previous diet.                           - Continue present medications.                           - Await pathology results.                           - Repeat colonoscopy (date not yet determined) for                            surveillance based on pathology results. Teresa Camba E. Tomasa Rand, MD 03/28/2023 11:34:58 AM This report has been signed electronically.

## 2023-03-28 NOTE — Progress Notes (Signed)
PT taken to PACU. Monitors in place. VSS. Report given to RN. 

## 2023-03-28 NOTE — Patient Instructions (Signed)
Thank you for coming in to see [place] today! Resume your diet and medications today. Return to regular daily activities tomorrow. Biopsy results will be available in 1-2 weeks at which time a surveillance colonoscopy will be recommended.    YOU HAD AN ENDOSCOPIC PROCEDURE TODAY AT THE [facility]:   Refer to the procedure report that was given to you for any specific questions about what was found during the examination.  If the procedure report does not answer your questions, please call your gastroenterologist to clarify.  If you requested that your care partner not be given the details of your procedure findings, then the procedure report has been included in a sealed envelope for you to review at your convenience later.  YOU SHOULD EXPECT: Some feelings of bloating in the abdomen. Passage of more gas than usual.  Walking can help get rid of the air that was put into your GI tract during the procedure and reduce the bloating. If you had a lower endoscopy (such as a colonoscopy or flexible sigmoidoscopy) you may notice spotting of blood in your stool or on the toilet paper. If you underwent a bowel prep for your procedure, you may not have a normal bowel movement for a few days.  Please Note:  You might notice some irritation and congestion in your nose or some drainage.  This is from the oxygen used during your procedure.  There is no need for concern and it should clear up in a day or so.  SYMPTOMS TO REPORT IMMEDIATELY:  Following lower endoscopy (colonoscopy or flexible sigmoidoscopy):  Excessive amounts of blood in the stool  Significant tenderness or worsening of abdominal pains  Swelling of the abdomen that is new, acute  Fever of 100F or higher     For urgent or emergent issues, a gastroenterologist can be reached at any hour by calling [phone]. Do not use MyChart messaging for urgent concerns.    DIET:  We do recommend a small meal at first, but then you may  proceed to your regular diet.  Drink plenty of fluids but you should avoid alcoholic beverages for 24 hours.  ACTIVITY:  You should plan to take it easy for the rest of today and you should NOT DRIVE or use heavy machinery until tomorrow (because of the sedation medicines used during the test).    FOLLOW UP: Our staff will call the number listed on your records the next business day following your procedure.  We will call around 7:15- 8:00 am to check on you and address any questions or concerns that you may have regarding the information given to you following your procedure. If we do not reach you, we will leave a message.     If any biopsies were taken you will be contacted by phone or by letter within the next 1-3 weeks.  Please call us at [phone] if you have not heard about the biopsies in 3 weeks.    SIGNATURES/CONFIDENTIALITY: You and/or your care partner have signed paperwork which will be entered into your electronic medical record.  These signatures attest to the fact that that the information above on your After Visit Summary has been reviewed and is understood.  Full responsibility of the confidentiality of this discharge information lies with you and/or your care-partner.

## 2023-03-28 NOTE — Telephone Encounter (Signed)
  Follow up Call-     03/27/2023    9:57 AM  Call back number  Post procedure Call Back phone  # [phone]  Permission to leave phone message Yes     Patient questions:  Do you have a fever, pain , or abdominal swelling? No. Pain Score  0 *  Have you tolerated food without any problems? Yes.    Have you been able to return to your normal activities? Yes.    Do you have any questions about your discharge instructions: Diet   No. Medications  No. Follow up visit  No.  Do you have questions or concerns about your Care? No.  Actions: * If pain score is 4 or above: No action needed, pain <4.

## 2023-03-28 NOTE — Progress Notes (Signed)
History and Physical Interval Note:  03/28/2023 10:51 AM  Teresa Robbins  has presented today for endoscopic procedure(s), with the diagnosis of  Encounter Diagnosis  Name Primary?   Positive colorectal cancer screening using Cologuard test Yes  .  The various methods of evaluation and treatment have been discussed with the patient and/or family. After consideration of risks, benefits and other options for treatment, the patient has consented to  the endoscopic procedure(s).   The patient's history has been reviewed, patient examined, no change in status, stable for endoscopic procedure(s).  I have reviewed the patient's chart and labs.  Questions were answered to the patient's satisfaction.     Iyesha Such E. Tomasa Rand, MD [facility]

## 2023-03-31 ENCOUNTER — Telehealth: Payer: Self-pay | Admitting: *Deleted

## 2023-03-31 NOTE — Telephone Encounter (Signed)
  Follow up Call-     03/28/2023   10:08 AM 03/27/2023    9:57 AM  Call back number  Post procedure Call Back phone  # [phone]  Permission to leave phone message Yes Yes     Patient questions:  Do you have a fever, pain , or abdominal swelling? No. Pain Score  0 *  Have you tolerated food without any problems? Yes.    Have you been able to return to your normal activities? Yes.    Do you have any questions about your discharge instructions: Diet   No. Medications  No. Follow up visit  No.  Do you have questions or concerns about your Care? No.  Actions: * If pain score is 4 or above: No action needed, pain <4.

## 2023-04-03 NOTE — Progress Notes (Signed)
Ms. Zaleski,  Good news: the polyp that I removed during your recent examination was NOT precancerous. Based on current colorectal cancer screening guidelines you would need a repeat colonoscopy in 10 years.   However, because colon cancer screening after age 73 is done on a case-by-case basis, taking into account the patient's risk factors for colon cancer, as well as comorbidities and life expectancy, I would recommend against any further colon cancer screening.

## 2023-04-14 DIAGNOSIS — M7581 Other shoulder lesions, right shoulder: Secondary | ICD-10-CM | POA: Diagnosis not present

## 2023-04-14 DIAGNOSIS — Z1330 Encounter for screening examination for mental health and behavioral disorders, unspecified: Secondary | ICD-10-CM | POA: Diagnosis not present

## 2023-05-07 DIAGNOSIS — J449 Chronic obstructive pulmonary disease, unspecified: Secondary | ICD-10-CM | POA: Diagnosis not present

## 2023-05-14 DIAGNOSIS — Z7982 Long term (current) use of aspirin: Secondary | ICD-10-CM | POA: Diagnosis not present

## 2023-05-14 DIAGNOSIS — F3342 Major depressive disorder, recurrent, in full remission: Secondary | ICD-10-CM | POA: Diagnosis not present

## 2023-05-29 ENCOUNTER — Ambulatory Visit: Payer: Medicare Other | Admitting: Podiatry

## 2023-06-12 ENCOUNTER — Encounter: Payer: Medicare Other | Admitting: Gastroenterology

## 2023-06-17 DIAGNOSIS — M2042 Other hammer toe(s) (acquired), left foot: Secondary | ICD-10-CM | POA: Diagnosis not present

## 2023-06-17 DIAGNOSIS — M2032 Hallux varus (acquired), left foot: Secondary | ICD-10-CM | POA: Diagnosis not present

## 2023-06-17 DIAGNOSIS — M79672 Pain in left foot: Secondary | ICD-10-CM | POA: Diagnosis not present

## 2023-07-02 DIAGNOSIS — R0989 Other specified symptoms and signs involving the circulatory and respiratory systems: Secondary | ICD-10-CM | POA: Diagnosis not present

## 2023-07-02 DIAGNOSIS — U071 COVID-19: Secondary | ICD-10-CM | POA: Diagnosis not present

## 2023-07-02 DIAGNOSIS — R059 Cough, unspecified: Secondary | ICD-10-CM | POA: Diagnosis not present

## 2023-07-02 DIAGNOSIS — J449 Chronic obstructive pulmonary disease, unspecified: Secondary | ICD-10-CM | POA: Diagnosis not present

## 2023-07-02 DIAGNOSIS — R0602 Shortness of breath: Secondary | ICD-10-CM | POA: Diagnosis not present

## 2023-08-09 DIAGNOSIS — M79675 Pain in left toe(s): Secondary | ICD-10-CM | POA: Diagnosis not present

## 2023-08-09 DIAGNOSIS — R030 Elevated blood-pressure reading, without diagnosis of hypertension: Secondary | ICD-10-CM | POA: Diagnosis not present

## 2023-08-09 DIAGNOSIS — Z6835 Body mass index (BMI) 35.0-35.9, adult: Secondary | ICD-10-CM | POA: Diagnosis not present

## 2023-08-09 DIAGNOSIS — S92592A Other fracture of left lesser toe(s), initial encounter for closed fracture: Secondary | ICD-10-CM | POA: Diagnosis not present

## 2023-08-13 DIAGNOSIS — S92345D Nondisplaced fracture of fourth metatarsal bone, left foot, subsequent encounter for fracture with routine healing: Secondary | ICD-10-CM | POA: Diagnosis not present

## 2023-08-27 DIAGNOSIS — M67911 Unspecified disorder of synovium and tendon, right shoulder: Secondary | ICD-10-CM | POA: Diagnosis not present

## 2023-09-23 DIAGNOSIS — Z9181 History of falling: Secondary | ICD-10-CM | POA: Diagnosis not present

## 2023-09-23 DIAGNOSIS — I119 Hypertensive heart disease without heart failure: Secondary | ICD-10-CM | POA: Diagnosis not present

## 2023-09-23 DIAGNOSIS — J449 Chronic obstructive pulmonary disease, unspecified: Secondary | ICD-10-CM | POA: Diagnosis not present

## 2023-09-23 DIAGNOSIS — E7800 Pure hypercholesterolemia, unspecified: Secondary | ICD-10-CM | POA: Diagnosis not present

## 2023-09-23 DIAGNOSIS — J439 Emphysema, unspecified: Secondary | ICD-10-CM | POA: Diagnosis not present

## 2023-09-23 DIAGNOSIS — I700 Atherosclerosis of aorta: Secondary | ICD-10-CM | POA: Diagnosis not present

## 2023-09-23 DIAGNOSIS — F339 Major depressive disorder, recurrent, unspecified: Secondary | ICD-10-CM | POA: Diagnosis not present

## 2023-09-23 DIAGNOSIS — Z0000 Encounter for general adult medical examination without abnormal findings: Secondary | ICD-10-CM | POA: Diagnosis not present

## 2023-09-30 DIAGNOSIS — M2022 Hallux rigidus, left foot: Secondary | ICD-10-CM | POA: Diagnosis not present

## 2023-09-30 DIAGNOSIS — M2032 Hallux varus (acquired), left foot: Secondary | ICD-10-CM | POA: Diagnosis not present

## 2023-09-30 DIAGNOSIS — M2042 Other hammer toe(s) (acquired), left foot: Secondary | ICD-10-CM | POA: Diagnosis not present

## 2023-10-22 DIAGNOSIS — M2032 Hallux varus (acquired), left foot: Secondary | ICD-10-CM | POA: Diagnosis not present

## 2023-10-22 DIAGNOSIS — M2042 Other hammer toe(s) (acquired), left foot: Secondary | ICD-10-CM | POA: Diagnosis not present

## 2023-10-22 DIAGNOSIS — T8484XA Pain due to internal orthopedic prosthetic devices, implants and grafts, initial encounter: Secondary | ICD-10-CM | POA: Diagnosis not present

## 2023-10-22 DIAGNOSIS — M2022 Hallux rigidus, left foot: Secondary | ICD-10-CM | POA: Diagnosis not present

## 2023-11-07 ENCOUNTER — Ambulatory Visit
Admission: RE | Admit: 2023-11-07 | Discharge: 2023-11-07 | Disposition: A | Discharge status: Home / Self Care | Payer: Medicare Other | Source: Ambulatory Visit | Attending: Physician Assistant | Admitting: Physician Assistant

## 2023-11-07 DIAGNOSIS — Z122 Encounter for screening for malignant neoplasm of respiratory organs: Secondary | ICD-10-CM

## 2023-11-07 DIAGNOSIS — E559 Vitamin D deficiency, unspecified: Secondary | ICD-10-CM | POA: Diagnosis not present

## 2023-11-07 DIAGNOSIS — Z87891 Personal history of nicotine dependence: Secondary | ICD-10-CM

## 2023-11-10 DIAGNOSIS — T8484XA Pain due to internal orthopedic prosthetic devices, implants and grafts, initial encounter: Secondary | ICD-10-CM | POA: Diagnosis not present

## 2023-11-10 DIAGNOSIS — M2042 Other hammer toe(s) (acquired), left foot: Secondary | ICD-10-CM | POA: Diagnosis not present

## 2023-11-10 DIAGNOSIS — Z419 Encounter for procedure for purposes other than remedying health state, unspecified: Secondary | ICD-10-CM | POA: Diagnosis not present

## 2023-11-10 DIAGNOSIS — M2022 Hallux rigidus, left foot: Secondary | ICD-10-CM | POA: Diagnosis not present

## 2023-11-17 ENCOUNTER — Other Ambulatory Visit: Payer: Self-pay

## 2023-11-17 DIAGNOSIS — Z87891 Personal history of nicotine dependence: Secondary | ICD-10-CM

## 2023-11-17 DIAGNOSIS — Z122 Encounter for screening for malignant neoplasm of respiratory organs: Secondary | ICD-10-CM

## 2023-12-02 DIAGNOSIS — M2032 Hallux varus (acquired), left foot: Secondary | ICD-10-CM | POA: Diagnosis not present

## 2023-12-02 DIAGNOSIS — Z9889 Other specified postprocedural states: Secondary | ICD-10-CM | POA: Diagnosis not present

## 2023-12-02 DIAGNOSIS — M2042 Other hammer toe(s) (acquired), left foot: Secondary | ICD-10-CM | POA: Diagnosis not present

## 2023-12-23 DIAGNOSIS — M2042 Other hammer toe(s) (acquired), left foot: Secondary | ICD-10-CM | POA: Diagnosis not present

## 2023-12-23 DIAGNOSIS — M2032 Hallux varus (acquired), left foot: Secondary | ICD-10-CM | POA: Diagnosis not present

## 2024-01-16 DIAGNOSIS — H33301 Unspecified retinal break, right eye: Secondary | ICD-10-CM | POA: Diagnosis not present

## 2024-01-16 DIAGNOSIS — H52203 Unspecified astigmatism, bilateral: Secondary | ICD-10-CM | POA: Diagnosis not present

## 2024-01-16 DIAGNOSIS — Z961 Presence of intraocular lens: Secondary | ICD-10-CM | POA: Diagnosis not present

## 2024-01-20 DIAGNOSIS — M79672 Pain in left foot: Secondary | ICD-10-CM | POA: Diagnosis not present

## 2024-01-22 DIAGNOSIS — M545 Low back pain, unspecified: Secondary | ICD-10-CM | POA: Diagnosis not present

## 2024-01-22 DIAGNOSIS — M160 Bilateral primary osteoarthritis of hip: Secondary | ICD-10-CM | POA: Diagnosis not present

## 2024-02-03 DIAGNOSIS — M5136 Other intervertebral disc degeneration, lumbar region with discogenic back pain only: Secondary | ICD-10-CM | POA: Diagnosis not present

## 2024-02-05 DIAGNOSIS — M5136 Other intervertebral disc degeneration, lumbar region with discogenic back pain only: Secondary | ICD-10-CM | POA: Diagnosis not present

## 2024-02-09 DIAGNOSIS — M5136 Other intervertebral disc degeneration, lumbar region with discogenic back pain only: Secondary | ICD-10-CM | POA: Diagnosis not present

## 2024-02-11 DIAGNOSIS — Z09 Encounter for follow-up examination after completed treatment for conditions other than malignant neoplasm: Secondary | ICD-10-CM | POA: Diagnosis not present

## 2024-03-12 DIAGNOSIS — Z9889 Other specified postprocedural states: Secondary | ICD-10-CM | POA: Diagnosis not present

## 2024-03-24 DIAGNOSIS — M810 Age-related osteoporosis without current pathological fracture: Secondary | ICD-10-CM | POA: Diagnosis not present

## 2024-03-24 DIAGNOSIS — E7800 Pure hypercholesterolemia, unspecified: Secondary | ICD-10-CM | POA: Diagnosis not present

## 2024-03-24 DIAGNOSIS — E041 Nontoxic single thyroid nodule: Secondary | ICD-10-CM | POA: Diagnosis not present

## 2024-03-24 DIAGNOSIS — Z853 Personal history of malignant neoplasm of breast: Secondary | ICD-10-CM | POA: Diagnosis not present

## 2024-03-24 DIAGNOSIS — J449 Chronic obstructive pulmonary disease, unspecified: Secondary | ICD-10-CM | POA: Diagnosis not present

## 2024-03-24 DIAGNOSIS — I119 Hypertensive heart disease without heart failure: Secondary | ICD-10-CM | POA: Diagnosis not present

## 2024-03-24 DIAGNOSIS — H35039 Hypertensive retinopathy, unspecified eye: Secondary | ICD-10-CM | POA: Diagnosis not present

## 2024-03-24 DIAGNOSIS — I700 Atherosclerosis of aorta: Secondary | ICD-10-CM | POA: Diagnosis not present

## 2024-04-20 DIAGNOSIS — M2042 Other hammer toe(s) (acquired), left foot: Secondary | ICD-10-CM | POA: Diagnosis not present

## 2024-04-20 DIAGNOSIS — M2032 Hallux varus (acquired), left foot: Secondary | ICD-10-CM | POA: Diagnosis not present

## 2024-04-20 DIAGNOSIS — M2141 Flat foot [pes planus] (acquired), right foot: Secondary | ICD-10-CM | POA: Diagnosis not present

## 2024-04-20 DIAGNOSIS — M2142 Flat foot [pes planus] (acquired), left foot: Secondary | ICD-10-CM | POA: Diagnosis not present

## 2024-05-24 DIAGNOSIS — Z79899 Other long term (current) drug therapy: Secondary | ICD-10-CM | POA: Diagnosis not present

## 2024-06-22 DIAGNOSIS — M810 Age-related osteoporosis without current pathological fracture: Secondary | ICD-10-CM | POA: Diagnosis not present

## 2024-06-25 DIAGNOSIS — B353 Tinea pedis: Secondary | ICD-10-CM | POA: Diagnosis not present

## 2024-06-25 DIAGNOSIS — R030 Elevated blood-pressure reading, without diagnosis of hypertension: Secondary | ICD-10-CM | POA: Diagnosis not present

## 2024-06-25 DIAGNOSIS — L0103 Bullous impetigo: Secondary | ICD-10-CM | POA: Diagnosis not present

## 2024-07-13 DIAGNOSIS — Z853 Personal history of malignant neoplasm of breast: Secondary | ICD-10-CM | POA: Diagnosis not present

## 2024-07-13 DIAGNOSIS — J439 Emphysema, unspecified: Secondary | ICD-10-CM | POA: Diagnosis not present

## 2024-07-13 DIAGNOSIS — M810 Age-related osteoporosis without current pathological fracture: Secondary | ICD-10-CM | POA: Diagnosis not present

## 2024-11-08 ENCOUNTER — Ambulatory Visit (HOSPITAL_BASED_OUTPATIENT_CLINIC_OR_DEPARTMENT_OTHER): Admitting: Radiology

## 2024-11-12 ENCOUNTER — Ambulatory Visit (INDEPENDENT_AMBULATORY_CARE_PROVIDER_SITE_OTHER)
Admission: RE | Admit: 2024-11-12 | Discharge: 2024-11-12 | Disposition: A | Source: Ambulatory Visit | Attending: Acute Care | Admitting: Acute Care

## 2024-11-12 DIAGNOSIS — Z122 Encounter for screening for malignant neoplasm of respiratory organs: Secondary | ICD-10-CM

## 2024-11-12 DIAGNOSIS — Z87891 Personal history of nicotine dependence: Secondary | ICD-10-CM

## 2024-11-17 ENCOUNTER — Other Ambulatory Visit: Payer: Self-pay

## 2024-11-17 DIAGNOSIS — Z122 Encounter for screening for malignant neoplasm of respiratory organs: Secondary | ICD-10-CM

## 2024-11-17 DIAGNOSIS — F17210 Nicotine dependence, cigarettes, uncomplicated: Secondary | ICD-10-CM

## 2024-11-17 DIAGNOSIS — Z87891 Personal history of nicotine dependence: Secondary | ICD-10-CM
